# Patient Record
Sex: Female | Born: 1965 | Race: White | Hispanic: No | Marital: Single | Smoking: Former smoker
Health system: Southern US, Community
[De-identification: ages and names within clinical notes are randomized; demographics above are authoritative.]

## PROBLEM LIST (undated history)

## (undated) DIAGNOSIS — R519 Headache, unspecified: Secondary | ICD-10-CM

## (undated) DIAGNOSIS — R200 Anesthesia of skin: Secondary | ICD-10-CM

## (undated) DIAGNOSIS — E119 Type 2 diabetes mellitus without complications: Secondary | ICD-10-CM

## (undated) DIAGNOSIS — G932 Benign intracranial hypertension: Secondary | ICD-10-CM

## (undated) DIAGNOSIS — I509 Heart failure, unspecified: Secondary | ICD-10-CM

## (undated) HISTORY — DX: Headache, unspecified: R51.9

## (undated) HISTORY — DX: Benign intracranial hypertension: G93.2

## (undated) HISTORY — PX: TUBAL LIGATION: SHX77

## (undated) HISTORY — PX: TOOTH EXTRACTION: SUR596

---

## 1998-10-04 ENCOUNTER — Ambulatory Visit: Admission: RE | Admit: 1998-10-04 | Discharge: 1998-10-04 | Payer: Self-pay | Admitting: Family Medicine

## 2013-05-17 DIAGNOSIS — I10 Essential (primary) hypertension: Secondary | ICD-10-CM | POA: Insufficient documentation

## 2013-05-17 DIAGNOSIS — G4733 Obstructive sleep apnea (adult) (pediatric): Secondary | ICD-10-CM | POA: Insufficient documentation

## 2013-06-06 DIAGNOSIS — E119 Type 2 diabetes mellitus without complications: Secondary | ICD-10-CM | POA: Insufficient documentation

## 2013-06-06 DIAGNOSIS — G932 Benign intracranial hypertension: Secondary | ICD-10-CM | POA: Insufficient documentation

## 2016-08-07 DIAGNOSIS — I272 Pulmonary hypertension, unspecified: Secondary | ICD-10-CM | POA: Insufficient documentation

## 2016-08-07 DIAGNOSIS — R0603 Acute respiratory distress: Secondary | ICD-10-CM | POA: Insufficient documentation

## 2016-08-09 DIAGNOSIS — I5033 Acute on chronic diastolic (congestive) heart failure: Secondary | ICD-10-CM | POA: Insufficient documentation

## 2016-08-12 ENCOUNTER — Ambulatory Visit: Payer: Self-pay | Admitting: Allergy and Immunology

## 2017-07-15 DIAGNOSIS — I10 Essential (primary) hypertension: Secondary | ICD-10-CM | POA: Diagnosis not present

## 2017-07-15 DIAGNOSIS — N183 Chronic kidney disease, stage 3 (moderate): Secondary | ICD-10-CM | POA: Diagnosis not present

## 2017-07-15 DIAGNOSIS — E119 Type 2 diabetes mellitus without complications: Secondary | ICD-10-CM | POA: Diagnosis not present

## 2017-07-16 DIAGNOSIS — N183 Chronic kidney disease, stage 3 (moderate): Secondary | ICD-10-CM | POA: Diagnosis not present

## 2017-07-16 DIAGNOSIS — I10 Essential (primary) hypertension: Secondary | ICD-10-CM

## 2017-07-16 DIAGNOSIS — E119 Type 2 diabetes mellitus without complications: Secondary | ICD-10-CM

## 2017-07-16 DIAGNOSIS — I509 Heart failure, unspecified: Secondary | ICD-10-CM

## 2017-07-16 DIAGNOSIS — R0602 Shortness of breath: Secondary | ICD-10-CM

## 2017-07-17 DIAGNOSIS — E119 Type 2 diabetes mellitus without complications: Secondary | ICD-10-CM | POA: Diagnosis not present

## 2017-07-17 DIAGNOSIS — I10 Essential (primary) hypertension: Secondary | ICD-10-CM | POA: Diagnosis not present

## 2017-07-17 DIAGNOSIS — N183 Chronic kidney disease, stage 3 (moderate): Secondary | ICD-10-CM | POA: Diagnosis not present

## 2017-07-18 DIAGNOSIS — N183 Chronic kidney disease, stage 3 (moderate): Secondary | ICD-10-CM | POA: Diagnosis not present

## 2017-07-18 DIAGNOSIS — E119 Type 2 diabetes mellitus without complications: Secondary | ICD-10-CM | POA: Diagnosis not present

## 2017-07-18 DIAGNOSIS — I10 Essential (primary) hypertension: Secondary | ICD-10-CM | POA: Diagnosis not present

## 2017-07-19 DIAGNOSIS — J9611 Chronic respiratory failure with hypoxia: Secondary | ICD-10-CM

## 2017-07-19 DIAGNOSIS — I10 Essential (primary) hypertension: Secondary | ICD-10-CM | POA: Diagnosis not present

## 2017-07-19 DIAGNOSIS — E119 Type 2 diabetes mellitus without complications: Secondary | ICD-10-CM | POA: Diagnosis not present

## 2017-07-19 DIAGNOSIS — N183 Chronic kidney disease, stage 3 (moderate): Secondary | ICD-10-CM | POA: Diagnosis not present

## 2017-07-20 DIAGNOSIS — N183 Chronic kidney disease, stage 3 (moderate): Secondary | ICD-10-CM | POA: Diagnosis not present

## 2017-07-20 DIAGNOSIS — I10 Essential (primary) hypertension: Secondary | ICD-10-CM | POA: Diagnosis not present

## 2017-07-20 DIAGNOSIS — E119 Type 2 diabetes mellitus without complications: Secondary | ICD-10-CM | POA: Diagnosis not present

## 2017-07-21 DIAGNOSIS — I10 Essential (primary) hypertension: Secondary | ICD-10-CM | POA: Diagnosis not present

## 2017-07-21 DIAGNOSIS — N183 Chronic kidney disease, stage 3 (moderate): Secondary | ICD-10-CM | POA: Diagnosis not present

## 2017-07-21 DIAGNOSIS — E119 Type 2 diabetes mellitus without complications: Secondary | ICD-10-CM | POA: Diagnosis not present

## 2017-07-22 DIAGNOSIS — E119 Type 2 diabetes mellitus without complications: Secondary | ICD-10-CM | POA: Diagnosis not present

## 2017-07-22 DIAGNOSIS — I10 Essential (primary) hypertension: Secondary | ICD-10-CM | POA: Diagnosis not present

## 2017-07-22 DIAGNOSIS — N183 Chronic kidney disease, stage 3 (moderate): Secondary | ICD-10-CM | POA: Diagnosis not present

## 2017-08-23 ENCOUNTER — Ambulatory Visit (INDEPENDENT_AMBULATORY_CARE_PROVIDER_SITE_OTHER): Payer: Medicaid Other | Admitting: Cardiology

## 2017-08-23 ENCOUNTER — Encounter: Payer: Self-pay | Admitting: Cardiology

## 2017-08-23 VITALS — BP 126/82 | HR 79 | Ht 70.0 in | Wt 374.0 lb

## 2017-08-23 DIAGNOSIS — I5032 Chronic diastolic (congestive) heart failure: Secondary | ICD-10-CM | POA: Diagnosis not present

## 2017-08-23 DIAGNOSIS — I272 Pulmonary hypertension, unspecified: Secondary | ICD-10-CM | POA: Diagnosis not present

## 2017-08-23 DIAGNOSIS — E119 Type 2 diabetes mellitus without complications: Secondary | ICD-10-CM

## 2017-08-23 DIAGNOSIS — I5030 Unspecified diastolic (congestive) heart failure: Secondary | ICD-10-CM | POA: Insufficient documentation

## 2017-08-23 DIAGNOSIS — I10 Essential (primary) hypertension: Secondary | ICD-10-CM

## 2017-08-23 LAB — BASIC METABOLIC PANEL
BUN/Creatinine Ratio: 20 (ref 9–23)
BUN: 24 mg/dL (ref 6–24)
CALCIUM: 9.5 mg/dL (ref 8.7–10.2)
CHLORIDE: 94 mmol/L — AB (ref 96–106)
CO2: 31 mmol/L — ABNORMAL HIGH (ref 20–29)
Creatinine, Ser: 1.18 mg/dL — ABNORMAL HIGH (ref 0.57–1.00)
GFR calc non Af Amer: 54 mL/min/{1.73_m2} — ABNORMAL LOW (ref >59)
GFR, EST AFRICAN AMERICAN: 62 mL/min/{1.73_m2} (ref >59)
GLUCOSE: 171 mg/dL — AB (ref 65–99)
POTASSIUM: 4.5 mmol/L (ref 3.5–5.2)
Sodium: 137 mmol/L (ref 134–144)

## 2017-08-23 LAB — PRO B NATRIURETIC PEPTIDE: NT-PRO BNP: 125 pg/mL (ref 0–249)

## 2017-08-23 NOTE — Progress Notes (Signed)
Cardiology Consultation:    Date:  08/23/2017   ID:  Emily Mooney, DOB [birth year], MRN [id number]  PCP:  Wilmer Floorampbell, Stephen D., MD  Cardiologist:  Gypsy Balsamobert Corneluis Allston, MD   Referring MD: Wilmer Floorampbell, Stephen D., MD   No chief complaint on file. I am short of breath  History of Present Illness:    Emily Mooney is a 52 y.o. female who is being seen today for the evaluation of diastolic congestive heart failure as well as pulmonary hypertension at the request of Wilmer Floorampbell, Stephen D., MD.  Few weeks ago she ended up going to the emergency room she was admitted she was diagnosed with diastolic congestive heart failure and she was treated appropriately with diuretics.  She lost 25 kg while in the hospital.  She was discharged home with instruction to follow-up with us.  She is still complaining of having some shortness of breath but much better than before.  Additional diagnosis that has been established while she was in the hospital is pulmonary hypertension.  She was also told that she most likely have sleep apnea and she required to be tested for it.  However apparently insurance company refused to pay for it.  Overall she does not do much she sits home all the time.  When she walks she gets short of breath fairly easily.  She does have some swelling of lower extremities but overall her weight has been maintained since that she discharged from hospital.  She takes only very small dose of water pill 20 mg of furosemide.  No potassium supplementation.  She snores a lot and years ago she had sleep study done and she was advised to use mask however her machine broke and she does not use it anymore.  She does have diabetes which was poorly controlled.  She is taking low intensity statin only Mevacor 40 mg with instruction to take only 20.  No past medical history on file.    Current Medications: Current Meds  Medication Sig  . carvedilol (COREG) 3.125 MG tablet Take 3.125 mg by mouth 2 (two) times daily.    . furosemide (LASIX) 20 MG tablet Take 1 tablet by mouth daily.  . furosemide (LASIX) 40 MG tablet Take 40 mg by mouth daily.  Marland Kitchen. glipiZIDE (GLUCOTROL) 5 MG tablet Take 1 tablet by mouth daily.  Marland Kitchen. lisinopril (PRINIVIL,ZESTRIL) 5 MG tablet Take 5 mg by mouth daily.  Marland Kitchen. lovastatin (MEVACOR) 40 MG tablet 20 mg.  . Omega-3 Fatty Acids (FISH OIL) 1000 MG CAPS Take 1 capsule by mouth daily.  Marland Kitchen. triamcinolone ointment (KENALOG) 0.5 % Apply 1 application topically as needed.     Allergies:   Patient has no known allergies.   Social History   Socioeconomic History  . Marital status: Single    Spouse name: Not on file  . Number of children: Not on file  . Years of education: Not on file  . Highest education level: Not on file  Occupational History  . Not on file  Social Needs  . Financial resource strain: Not on file  . Food insecurity:    Worry: Not on file    Inability: Not on file  . Transportation needs:    Medical: Not on file    Non-medical: Not on file  Tobacco Use  . Smoking status: Former Games developermoker  . Smokeless tobacco: Never Used  Substance and Sexual Activity  . Alcohol use: Not Currently  . Drug use: Not on file  . Sexual activity:  Not on file  Lifestyle  . Physical activity:    Days per week: Not on file    Minutes per session: Not on file  . Stress: Not on file  Relationships  . Social connections:    Talks on phone: Not on file    Gets together: Not on file    Attends religious service: Not on file    Active member of club or organization: Not on file    Attends meetings of clubs or organizations: Not on file    Relationship status: Not on file  Other Topics Concern  . Not on file  Social History Narrative  . Not on file     Family History: The patient's family history includes Cancer in her father. ROS:   Please see the history of present illness.    All 14 point review of systems negative except as described per history of present  illness.  EKGs/Labs/Other Studies Reviewed:    The following studies were reviewed today:   Recent Labs: No results found for requested labs within last 8760 hours.  Recent Lipid Panel No results found for: CHOL, TRIG, HDL, CHOLHDL, VLDL, LDLCALC, LDLDIRECT  Physical Exam:    VS:  BP 126/82 (BP Location: Right Arm, Patient Position: Sitting, Cuff Size: Normal)   Pulse 79   Ht 5\' 10"  (1.778 m)   Wt (!) 374 lb (169.6 kg)   SpO2 97%   BMI 53.66 kg/m     Wt Readings from Last 3 Encounters:  08/23/17 (!) 374 lb (169.6 kg)     GEN:  Well nourished, well developed in no acute distress HEENT: Normal NECK: No JVD; No carotid bruits LYMPHATICS: No lymphadenopathy CARDIAC: RRR, no murmurs, no rubs, no gallops RESPIRATORY:  Clear to auscultation without rales, wheezing or rhonchi  ABDOMEN: Soft, non-tender, non-distended MUSCULOSKELETAL:  No edema; No deformity  SKIN: Warm and dry NEUROLOGIC:  Alert and oriented x 3 PSYCHIATRIC:  Normal affect   ASSESSMENT:    1. Essential hypertension   2. Pulmonary HTN (HCC)   3. Chronic diastolic congestive heart failure (HCC)   4. Morbid obesity (HCC)   5. Type 2 diabetes mellitus without complication, without long-term current use of insulin (HCC)    PLAN:    In order of problems listed above:  1. Dyspnea on exertion.  Clearly multifactorial.  She does have morbid obesity also diastolic congestive heart failure as well as pulmonary hypertension.  All this problem need to be managed.  I will ask you to have Chem-7 today as well as proBNP based on that we will see if we need to intensify her diuresis.  Likely her weight has been stable since discharge from hospital.  I did review echocardiogram done in the hospital showed preserved left ventricular ejection fraction the most alarming to me however is the fact that her pulmonary pressures 59 mmHg. 2. Pulmonary hypertension with pulmonary pressure 59 mmHg measured by echocardiogram.  I suspect  the reason for this is a sleep apnea plus hypoventilation syndrome with hypoxia.  She is instructed to use oxygen all the time.  However she is in office without oxygen today.  I will schedule her to have a sleep study after that I am convinced she will see pulmonologist.  Will be able to address those issues he may require cardiac catheterization in the future I would look at her right side pressures to determine therapy needed for more pulmonary hypertension however we need to start from basic managing  her hypoxia as well as potential sleep apnea.   3. Morbid obesity obviously a problem.  We talked about diet and exercises on the regular basis.   4. Diabetes: Apparently poorly controlled.  Will check hemoglobin A1c.    Overall fairly sick lady required frequent visit.  I will schedule her to have sleep study Chem-7 as well as proBNP will be done today.  Will initiate aspirin therapy.  I see her back in my office in about a month.   Edication Adjustments/Labs and Tests Ordered: Current medicines are reviewed at length with the patient today.  Concerns regarding medicines are outlined above.  No orders of the defined types were placed in this encounter.  No orders of the defined types were placed in this encounter.   Signed, Georgeanna Lea, MD, [facility]. 08/23/2017 10:37 AM    [facility]

## 2017-08-23 NOTE — Patient Instructions (Signed)
Medication Instructions:  Your physician recommends that you continue on your current medications as directed. Please refer to the Current Medication list given to you today.  Labwork: Your physician recommends that you have the following labs drawn: Bmp and BNp  Testing/Procedures: Your physician has recommended that you have a sleep study. This test records several body functions during sleep, including: brain activity, eye movement, oxygen and carbon dioxide blood levels, heart rate and rhythm, breathing rate and rhythm, the flow of air through your mouth and nose, snoring, body muscle movements, and chest and belly movement.  Follow-Up: Your physician recommends that you schedule a follow-up appointment in: 1 month  Any Other Special Instructions Will Be Listed Below (If Applicable).     If you need a refill on your cardiac medications before your next appointment, please call your pharmacy.   [facility]  Garey HamAshley A, RN, BSN

## 2017-08-24 ENCOUNTER — Telehealth: Payer: Self-pay

## 2017-08-24 ENCOUNTER — Telehealth: Payer: Self-pay | Admitting: Cardiology

## 2017-08-24 ENCOUNTER — Telehealth: Payer: Self-pay | Admitting: *Deleted

## 2017-08-24 NOTE — Telephone Encounter (Signed)
-----   Message from Craige CottaAshley S Anderson, RN sent at 08/23/2017 10:45 AM EDT ----- Regarding: Sleep study [facility]

## 2017-08-24 NOTE — Telephone Encounter (Signed)
Staff message sent to [place] per [employer] no PA required. Ok to schedule sleep study.

## 2017-08-24 NOTE — Telephone Encounter (Signed)
Called patient and notified her of her lab results.  

## 2017-08-25 NOTE — Telephone Encounter (Signed)
-----   Message from Gaynelle CageWanda M Waddell, CMA sent at 08/24/2017  4:46 PM EDT ----- Regarding: RE: Sleep study Per patient's insurance no PA required. Ok to schedule. Call [phone] schedule. ----- Message ----- From: Craige CottaAnderson, Ashley S, RN Sent: 08/23/2017  10:45 AM To: [facility] Subject: Sleep study                                    [facility]

## 2017-08-25 NOTE — Telephone Encounter (Signed)
Informed patient of her sleep study date and time, address was provided.

## 2017-08-29 ENCOUNTER — Encounter: Payer: Self-pay | Admitting: Neurology

## 2017-08-30 ENCOUNTER — Emergency Department (HOSPITAL_COMMUNITY)
Admission: EM | Admit: 2017-08-30 | Discharge: 2017-08-31 | Disposition: A | Discharge status: Home / Self Care | Payer: PRIVATE HEALTH INSURANCE | Attending: Emergency Medicine | Admitting: Emergency Medicine

## 2017-08-30 ENCOUNTER — Emergency Department (HOSPITAL_COMMUNITY): Payer: PRIVATE HEALTH INSURANCE

## 2017-08-30 ENCOUNTER — Encounter (HOSPITAL_COMMUNITY): Payer: Self-pay

## 2017-08-30 DIAGNOSIS — I5033 Acute on chronic diastolic (congestive) heart failure: Secondary | ICD-10-CM | POA: Insufficient documentation

## 2017-08-30 DIAGNOSIS — E119 Type 2 diabetes mellitus without complications: Secondary | ICD-10-CM | POA: Diagnosis not present

## 2017-08-30 DIAGNOSIS — Z87891 Personal history of nicotine dependence: Secondary | ICD-10-CM | POA: Diagnosis not present

## 2017-08-30 DIAGNOSIS — G932 Benign intracranial hypertension: Secondary | ICD-10-CM

## 2017-08-30 DIAGNOSIS — I110 Hypertensive heart disease with heart failure: Secondary | ICD-10-CM | POA: Diagnosis not present

## 2017-08-30 DIAGNOSIS — H547 Unspecified visual loss: Secondary | ICD-10-CM | POA: Diagnosis not present

## 2017-08-30 DIAGNOSIS — H543 Unqualified visual loss, both eyes: Secondary | ICD-10-CM

## 2017-08-30 DIAGNOSIS — Z79899 Other long term (current) drug therapy: Secondary | ICD-10-CM | POA: Diagnosis not present

## 2017-08-30 DIAGNOSIS — Z7984 Long term (current) use of oral hypoglycemic drugs: Secondary | ICD-10-CM | POA: Insufficient documentation

## 2017-08-30 HISTORY — DX: Heart failure, unspecified: I50.9

## 2017-08-30 HISTORY — DX: Type 2 diabetes mellitus without complications: E11.9

## 2017-08-30 HISTORY — DX: Anesthesia of skin: R20.0

## 2017-08-30 LAB — CBC WITH DIFFERENTIAL/PLATELET
Abs Immature Granulocytes: 0 10*3/uL (ref 0.0–0.1)
BASOS ABS: 0 10*3/uL (ref 0.0–0.1)
Basophils Relative: 0 %
Eosinophils Absolute: 0.4 10*3/uL (ref 0.0–0.7)
Eosinophils Relative: 4 %
HCT: 37.3 % (ref 36.0–46.0)
HEMOGLOBIN: 11.8 g/dL — AB (ref 12.0–15.0)
Immature Granulocytes: 0 %
LYMPHS PCT: 21 %
Lymphs Abs: 1.8 10*3/uL (ref 0.7–4.0)
MCH: 28.3 pg (ref 26.0–34.0)
MCHC: 31.6 g/dL (ref 30.0–36.0)
MCV: 89.4 fL (ref 78.0–100.0)
MONO ABS: 0.5 10*3/uL (ref 0.1–1.0)
Monocytes Relative: 6 %
NEUTROS ABS: 5.7 10*3/uL (ref 1.7–7.7)
Neutrophils Relative %: 69 %
Platelets: 237 10*3/uL (ref 150–400)
RBC: 4.17 MIL/uL (ref 3.87–5.11)
RDW: 18.8 % — ABNORMAL HIGH (ref 11.5–15.5)
WBC: 8.4 10*3/uL (ref 4.0–10.5)

## 2017-08-30 LAB — BASIC METABOLIC PANEL
Anion gap: 11 (ref 5–15)
BUN: 34 mg/dL — ABNORMAL HIGH (ref 6–20)
CALCIUM: 9.3 mg/dL (ref 8.9–10.3)
CHLORIDE: 92 mmol/L — AB (ref 98–111)
CO2: 34 mmol/L — ABNORMAL HIGH (ref 22–32)
Creatinine, Ser: 1.39 mg/dL — ABNORMAL HIGH (ref 0.44–1.00)
GFR calc Af Amer: 50 mL/min — ABNORMAL LOW (ref >60)
GFR calc non Af Amer: 43 mL/min — ABNORMAL LOW (ref >60)
Glucose, Bld: 163 mg/dL — ABNORMAL HIGH (ref 70–99)
POTASSIUM: 4.3 mmol/L (ref 3.5–5.1)
SODIUM: 137 mmol/L (ref 135–145)

## 2017-08-30 MED ORDER — ACETAZOLAMIDE 250 MG PO TABS
500.0000 mg | ORAL_TABLET | Freq: Once | ORAL | Status: AC
  Administered 2017-08-30: 500 mg via ORAL
  Filled 2017-08-30 (×2): qty 2

## 2017-08-30 MED ORDER — LIDOCAINE HCL 2 % IJ SOLN
20.0000 mL | Freq: Once | INTRAMUSCULAR | Status: AC
  Administered 2017-08-30: 400 mg via INTRADERMAL
  Filled 2017-08-30: qty 20

## 2017-08-30 NOTE — ED Triage Notes (Signed)
[facility].  Visual field interpretation findings:   OD complete field loss, small central remaining on confrontation OS small 3 degree central area remaining, small ten remaining on confrontation  Pt has appt with neurology 10/2017 but recommends being seen in ED today.

## 2017-08-30 NOTE — ED Provider Notes (Signed)
[facility] EMERGENCY DEPARTMENT Provider Note   CSN: [id number] Arrival date & time: 08/30/17  1208     History   Chief Complaint Chief Complaint  Patient presents with  . Eye Problem    HPI Emily Mooney is a 52 y.o. female.  Patient with history of idiopathic intercranial hypertension, 3 previous lumbar punctures --presents to the emergency department with progressive vision loss over the past 2 months.  Patient saw an ophthalmologist today.  She had visual testing showing minimal remaining central vision in each eye.  Patient describes loss of vision in the periphery of both eyes, right greater than left.  Symptoms fluctuate at times and everything will "go gray".  Patient denies signs of stroke including: facial droop, slurred speech, aphasia, weakness/numbness in extremities, imbalance/trouble walking.  Patient was recently in the hospital for congestive heart failure and states that she had 60 pounds of fluid taken off of her.  Her breathing is currently stable.  She is oxygen dependent.  No chest pain or shortness of breath.  The onset of this condition was gradual. The course is worsening. Aggravating factors: none. Alleviating factors: none.        Past Medical History:  Diagnosis Date  . CHF (congestive heart failure) (HCC)   . Diabetes mellitus without complication (HCC)   . Numbness in feet     Patient Active Problem List   Diagnosis Date Noted  . Diastolic congestive heart failure (HCC) 08/23/2017  . Morbid obesity (HCC) 08/23/2017  . Acute on chronic diastolic congestive heart failure (HCC) 08/09/2016  . Acute respiratory distress 08/07/2016  . Pulmonary HTN (HCC) 08/07/2016  . Benign intracranial hypertension 06/06/2013  . Diabetes (HCC) 06/06/2013  . Hypertension 05/17/2013  . Obstructive sleep apnea 05/17/2013    Past Surgical History:  Procedure Laterality Date  . TOOTH EXTRACTION    . TUBAL LIGATION       OB History   None       Home Medications    Prior to Admission medications   Medication Sig Start Date End Date Taking? Authorizing Provider  carvedilol (COREG) 3.125 MG tablet Take 3.125 mg by mouth 2 (two) times daily. 08/05/17  Yes [provider]  CINNAMON PO Take 1,000 mg by mouth daily.   Yes [provider]  furosemide (LASIX) 40 MG tablet Take 40 mg by mouth daily. May increase to 60mg  if weight up 3 pounds   Yes [provider]  glipiZIDE (GLUCOTROL XL) 5 MG 24 hr tablet Take 5 mg by mouth daily with breakfast.   Yes [provider]  lisinopril (PRINIVIL,ZESTRIL) 5 MG tablet Take 5 mg by mouth daily. 08/01/17  Yes [provider]  lovastatin (MEVACOR) 20 MG tablet Take 40 mg by mouth daily.   Yes [provider]  Omega-3 Fatty Acids (FISH OIL) 1000 MG CAPS Take 3,000 mg by mouth daily.    Yes [provider]  SitaGLIPtin-MetFORMIN HCl (JANUMET XR) 50-1000 MG TB24 Take 1 tablet by mouth daily.   Yes [provider]  triamcinolone ointment (KENALOG) 0.5 % Apply 1 application topically 2 (two) times daily.  05/18/17  Yes [provider]    Family History Family History  Problem Relation Age of Onset  . Cancer Father     Social History Social History   Tobacco Use  . Smoking status: Former Games developer  . Smokeless tobacco: Never Used  Substance Use Topics  . Alcohol use: Not Currently  . Drug use:  Never     Allergies   Patient has no known allergies.   Review of Systems Review of Systems  Constitutional: Negative for fever.  HENT: Negative for rhinorrhea and sore throat.   Eyes: Positive for visual disturbance. Negative for redness.  Respiratory: Negative for cough.   Cardiovascular: Negative for chest pain.  Gastrointestinal: Negative for abdominal pain, diarrhea, nausea and vomiting.  Genitourinary: Negative for dysuria.  Musculoskeletal: Negative for myalgias.  Skin: Negative for rash.  Neurological:  Negative for headaches.     Physical Exam Updated Vital Signs BP 120/72   Pulse 66   Temp 98 F (36.7 C) (Oral)   Resp 16   SpO2 92%   Physical Exam  Constitutional: She appears well-developed and well-nourished.  HENT:  Head: Normocephalic and atraumatic.  Eyes: Conjunctivae are normal. Right eye exhibits no discharge. Left eye exhibits no discharge.  Neck: Normal range of motion. Neck supple.  Cardiovascular: Normal rate, regular rhythm and normal heart sounds.  Pulmonary/Chest: Effort normal and breath sounds normal.  Abdominal: Soft. There is no tenderness.  Neurological: She is alert. She has normal strength. A cranial nerve deficit is present. No sensory deficit. She displays a negative Romberg sign. GCS eye subscore is 4. GCS verbal subscore is 5. GCS motor subscore is 6.  Patient with minimal central vision on confrontation.   Skin: Skin is warm and dry.  Psychiatric: She has a normal mood and affect.  Nursing note and vitals reviewed.    ED Treatments / Results  Labs (all labs ordered are listed, but only abnormal results are displayed) Labs Reviewed  CBC WITH DIFFERENTIAL/PLATELET - Abnormal; Notable for the following components:      Result Value   Hemoglobin 11.8 (*)    RDW 18.8 (*)    All other components within normal limits  BASIC METABOLIC PANEL - Abnormal; Notable for the following components:   Chloride 92 (*)    CO2 34 (*)    Glucose, Bld 163 (*)    BUN 34 (*)    Creatinine, Ser 1.39 (*)    GFR calc non Af Amer 43 (*)    GFR calc Af Amer 50 (*)    All other components within normal limits    EKG None  Radiology Ct Head Wo Contrast  Result Date: 08/30/2017 CLINICAL DATA:  Idiopathic intracranial hypertension with progressive visual loss over the past 2 months. EXAM: CT HEAD WITHOUT CONTRAST TECHNIQUE: Contiguous axial images were obtained from the base of the skull through the vertex without intravenous contrast. COMPARISON:  08/08/2016  FINDINGS: Brain: Slight flattening of the posterior globes with slight kinking of the optic nerves can be seen in the setting of idiopathic intracranial hypertension (IIH). Additionally there is a partially empty pituitary sella which can also be seen in the setting of IIH. No acute intracranial hemorrhage, midline shift or edema. No large vascular territory infarct. No intra-axial mass nor extra-axial fluid collections. No hydrocephalus or slit-like ventricles. Vascular: No hyperdense vessel sign. Skull: Normal Sinuses/Orbits: Clear paranasal sinuses and mastoids. Flattening of the posterior aspect of the globes and slight kinking of the optic nerves as above described. Other: None IMPRESSION: Stigmata of idiopathic intracranial hypertension with slight kinking of the optic nerves bilaterally, flattening of the posterior aspect of the globes and anterior partially empty appearing pituitary sella. Electronically Signed   By: Tollie Eth M.D.   On: 08/30/2017 20:33    Procedures Procedures (including critical care time)  Medications Ordered in ED  Medications  lidocaine (XYLOCAINE) 2 % (with pres) injection 400 mg (400 mg Intradermal Given 08/30/17 2334)  acetaZOLAMIDE (DIAMOX) tablet 500 mg (500 mg Oral Given 08/30/17 2328)     Initial Impression / Assessment and Plan / ED Course  I have reviewed the triage vital signs and the nursing notes.  Pertinent labs & imaging results that were available during my care of the patient were reviewed by me and considered in my medical decision making (see chart for details).     Patient seen and examined. Head CT pending. Labs ordered. Discussed with Dr. Anitra LauthPlunkett.   Vital signs reviewed and are as follows: BP 120/72   Pulse 66   Temp 98 F (36.7 C) (Oral)   Resp 16   SpO2 92%   Spoke with Dr. Otelia LimesLindzen -- states LP with manometry, drain 20cc or to pressure of 20.   I attempted x 1 with patient in left lateral decub without success. Dr. Anitra LauthPlunkett then  attempted and was successful with patient sitting up. Pressure from > 45 to 20 after drainage of 30cc. Please see her note for details.   Diamox ordered and given in ED.  Discussed with patient the importance of her following up with both ophthalmology and with neurology.  Discussed that she may need an advanced procedure such as a fenestration or VP shunt and follow-up is extraordinarily important for this.  Discussed that we are trying to save her remaining central vision.  Also discussed the importance of compliance with medications.  Will start patient back on Diamox tonight.  12:30 AM Patient has done well after LP.  She is ready for discharge.  Ambulatory for referral to neurology placed.  Post lumbar puncture recommendations given.  Final Clinical Impressions(s) / ED Diagnoses   Final diagnoses:  Idiopathic intracranial hypertension  Vision loss, bilateral   Patient with pseudotumor cerebri with progressive vision loss.  This has been progressive over months.  Therapeutic lumbar puncture performed tonight as documented.  Stressed importance of follow-up with ophthalmology and neurology.  Stressed importance of restarting Diamox.  Patient verbalizes understanding.  Comfortable discharged home at this time.  ED Discharge Orders        Ordered    acetaZOLAMIDE (DIAMOX) 250 MG tablet  2 times daily     08/31/17 0019    Ambulatory referral to Neurology    Comments:  An appointment is requested in approximately: 1 week  H/o pseudotumor, progressive vision loss x months, LP performed in ED tonight.   08/31/17 0021       Renne CriglerGeiple, Faysal Fenoglio, PA-C 08/31/17 0031    Gwyneth SproutPlunkett, Whitney, MD 09/02/17 [phone]

## 2017-08-31 MED ORDER — ACETAZOLAMIDE 250 MG PO TABS: 500.0000 mg | ORAL_TABLET | Freq: Two times a day (BID) | ORAL | 0 refills | Status: DC

## 2017-08-31 NOTE — ED Notes (Signed)
Patient verbalizes understanding of discharge instructions. Opportunity for questioning and answers were provided. Armband removed by staff, pt discharged from ED in wheelchair.  

## 2017-08-31 NOTE — ED Provider Notes (Signed)
.  Lumbar Puncture Date/Time: 08/31/2017 12:23 AM Performed by: Gwyneth SproutPlunkett, Miliana Gangwer, MD Authorized by: Gwyneth SproutPlunkett, Madia Carvell, MD   Consent:    Consent obtained:  Written   Consent given by:  Patient   Risks discussed:  Bleeding, headache, infection, pain and repeat procedure   Alternatives discussed:  No treatment Pre-procedure details:    Procedure purpose:  Therapeutic   Preparation: Patient was prepped and draped in usual sterile fashion   Anesthesia (see MAR for exact dosages):    Anesthesia method:  Local infiltration   Local anesthetic:  Lidocaine 2% WITH epi Procedure details:    Lumbar space:  L4-L5 interspace   Patient position:  Sitting   Needle gauge:  22   Needle type:  Diamond point   Needle length (in):  3.5   Ultrasound guidance: no     Number of attempts:  3   Opening pressure (cm H2O):  54   Closing pressure (cm H2O):  20   Fluid appearance:  Clear   Tubes of fluid:  4   Total volume (ml):  30 Post-procedure:    Puncture site:  Adhesive bandage applied   Patient tolerance of procedure:  Tolerated well, no immediate complications      Gwyneth SproutPlunkett, Domenik Trice, MD 08/31/17 [id number]

## 2017-08-31 NOTE — Discharge Instructions (Signed)
Please read and follow all provided instructions.  Your diagnoses today include:  1. Idiopathic intracranial hypertension   2. Vision loss, bilateral     Tests performed today include:  CT head - shows signs of increased pressure  Vital signs. See below for your results today.   Medications prescribed:   Diamox  Take any prescribed medications only as directed.  Home care instructions:  Follow any educational materials contained in this packet.  BE VERY CAREFUL not to take multiple medicines containing Tylenol (also called acetaminophen). Doing so can lead to an overdose which can damage your liver and cause liver failure and possibly death.   Follow-up instructions: Follow-up with your neurologist as soon as possible and follow-up with your eye specialist for further evaluation.  Return instructions:   Please return to the Emergency Department if you experience worsening symptoms.   Please return if you have any other emergent concerns.  Additional Information:  Your vital signs today were: BP 122/73    Pulse 67    Temp 98 F (36.7 C) (Oral)    Resp 18    SpO2 100%  If your blood pressure (BP) was elevated above 135/85 this visit, please have this repeated by your doctor within one month. --------------

## 2017-09-02 ENCOUNTER — Encounter: Payer: Self-pay | Admitting: Diagnostic Neuroimaging

## 2017-09-02 ENCOUNTER — Ambulatory Visit (INDEPENDENT_AMBULATORY_CARE_PROVIDER_SITE_OTHER): Payer: PRIVATE HEALTH INSURANCE | Admitting: Diagnostic Neuroimaging

## 2017-09-02 ENCOUNTER — Telehealth: Payer: Self-pay

## 2017-09-02 VITALS — BP 125/61 | HR 85 | Ht 70.0 in | Wt 363.0 lb

## 2017-09-02 DIAGNOSIS — H543 Unqualified visual loss, both eyes: Secondary | ICD-10-CM

## 2017-09-02 DIAGNOSIS — H4711 Papilledema associated with increased intracranial pressure: Secondary | ICD-10-CM | POA: Diagnosis not present

## 2017-09-02 DIAGNOSIS — G932 Benign intracranial hypertension: Secondary | ICD-10-CM | POA: Diagnosis not present

## 2017-09-02 DIAGNOSIS — G4733 Obstructive sleep apnea (adult) (pediatric): Secondary | ICD-10-CM | POA: Diagnosis not present

## 2017-09-02 DIAGNOSIS — I5032 Chronic diastolic (congestive) heart failure: Secondary | ICD-10-CM | POA: Diagnosis not present

## 2017-09-02 MED ORDER — ACETAZOLAMIDE ER 500 MG PO CP12: 500.0000 mg | ORAL_CAPSULE | Freq: Two times a day (BID) | ORAL | 12 refills | Status: DC

## 2017-09-02 NOTE — Patient Instructions (Signed)
idiopathic intracranial hypertension (pseudotumor cerebri)  - start acetazolamide ER 500mg  twice a day  - refer back to neurosurgery (Dr. Petra KubaKilpatrick, Epifanio LeschesMichaux Renata) for consideration of shunt due to severe symptoms and to avoid permanent vision loss - follow up with ophthalmology [facility] - follow up with PCP, cardiology

## 2017-09-02 NOTE — Telephone Encounter (Signed)
I called [facility], per Dr. Marjory LiesPenumalli request, to advise them that pt has had a consult today with Dr. Marjory LiesPenumalli regarding pt's papilledema. They did not answer the phone. Will call them again later.

## 2017-09-02 NOTE — Progress Notes (Signed)
[facility]  PATIENT: Emily Mooney DOB: December 12, 1965  REFERRING CLINICIAN: ER  HISTORY FROM: patient and chart review  REASON FOR VISIT: new consult    HISTORICAL  CHIEF COMPLAINT:  Chief Complaint  Patient presents with  . New Patient (Initial Visit)    RM 7, with father, Dennard Nip. F/u for pseudotumor, progressive vision loss. Pt reports that her left eye is worse that her right. Pt has not started diamox due to cost but plans on picking it up.   . Vision:    Without correction- R:20/50, L: unable to see anything, B: 20/200    HISTORY OF PRESENT ILLNESS:   NEW HPI (09/02/17, VRP): 52 year old female with history of heart failure, obesity, pseudotumor cerebri, here for evaluation.  I reviewed prior neurology notes which are referred to below.  Patient was initially diagnosed with pseudotumor cerebri in 2004, tried on Topamax and Diamox in the past.  She was referred for bariatric surgery as well as VP shunt placement in the past but could not afford these due to financial limitations.  In the past 1 year patient was not able to afford her medications and therefore stopped taking her medications.  Patient symptoms have worsened.  3 months ago she started to have increasing blurred vision, transient visual obscurations, ultimately going to the emergency room on 08/30/2017.  Lumbar puncture was obtained which showed opening pressure of 54 cm of water.  Closing pressure was 20 cm water.  Patient was prescribed acetazolamide but she did not start this as she was not able to afford her medication.  Patient having some mild headaches and tinnitus.  PRIOR RECORDS PER AMY KEARNS, PA-c: " Emily Mooney is a 52 y.o. female seen at [facility] for a follow up visit for the following: Pseudotumor cerebri. The onset of the blurring of vision has been sudden and has been occurring in an intermittent (mostly notices blurred vision when writing) pattern for  months (started in January). The course has been recurrent. The blurring of vision is characterized as painless and a blurring of vision. The symptoms have been associated with diabetes mellitus, while the symptoms have not been associated with diplopia or headache. Note for "Blurring of vision": [name] was referred to our office by her optometrist due to recent exam findings of bilateral papilledema. Her PMH is significant for pseudotumor cerebri. She was previously diagnosed with this around 2004 (treated by Dr. Orie Rout). She denies ever having a scan of her brain and no records are available. Past treatment for the pseudotumor cerebri includes a large volume spinal tap, Diamox, and Topamax. She is unsure how long ago she stopped taking the medications. Denies any recent weight gain. She actually reports a weight loss of 56 lbs since January of this year.  Update 06/06/13 (above notes are from initial evaluation). [name] is here today for follow up after having a MRI of her brain. The results of the MRI were reviewed with her (under Diagnostic Studies). She reports some improvement in her blurred vision. She is now getting a headache every time she coughs.  Update 07/25/13. She is here today for follow up after having a LP on 07/05/13. Her opening pressure of 37 cm was diagnostic of pseudotumor cerebri. She was started on Diamox 250 mg, 2 pills tid after the LP. She denies having any side effects from the medication but has noticed an increased frequency in urinating. She reports having 5-6 headaches per week. The headaches last only  a few seconds and are described as sharp. She does not take anything for acute treatment of the headaches since they only last a few seconds. She has not returned to the optometrist for a recheck.  Update 08/29/13. She had a dilated eye exam on 08/02/13. Per Dr. Guillermina [place] note she still had subtle papilledema (OD > OS). She has been taking the Diamox as directed. She is still having  some blurred vision and brief headaches daily. She has lost 8 lbs since her last appointment.   Update 10/10/13. Topamax was added at the last appointment. She is now taking 75 mg with no side effects reported. She reports that her headaches have stopped. She is also no longer having any blurred vision. Her weight is down 4 lbs since her last appointment.   Update 12/12/13. After her last appointment she was evaluated by optometry again who found that she still had some mild papilledema. The dose of Topamax was then increased to 150 mg. The only side effect reported is intermittent paresthesias in her hands and feet. She also has experienced some dizziness but is not sure if it is related to the Topamax. She still has intermittent blurred vision and brief sharp headaches. She has not lost any more weight.   Update 02/13/14. At the last appointment the dose of Topamax was increased to 200 mg and she was also referred to bariatric surgery. Unfortunately her insurance will not cover bariatric surgery. She denies having any side effects from the increased dose of Topamax. She reports having blurred vision but only when reading. She reports that the frequency of her brief headaches has decreased. She was evaluated again by optometry on 12/21/13. She was found to still have bilateral papilledema. Her weight is down 10 lbs since her last appointment.   Update 04/10/14. The dose of Topamax was increased to 300 mg daily at the last appointment. She had a repeat LP on 03/08/14. Her opening pressure was 29 which was down from 37 in 06/2013. The dose of Topamax was then increased to 400 mg daily. On 03/21/14 she had formal VF testing which showed evidence of apparent inferior binasal hemianopia. Her weight today in the office remains at 330 lbs.   Update 06/20/14. At the last appointment she was referred to Neurosurgery for evaluation regarding spinal fluid shunt. Unfortunately her current health insurance does not cover  surgical procedures. Today her weight is at 334 lbs. She denies having any headaches or visual changes.  Update 10/30/14. Today she complains of having blurred vision with frequent "flash headaches" that last only a few minutes. She has not had an eye exam since February of this year. She did not follow up with Neurosurgery because her insurance at that time would not pay for surgery. She recently got a new job and now has Medicaid. She continues to take Topamax and Diamox as prescribed.   Update 03/24/16. It has been over a year since patient was last seen in the office. Patient had an eye exam yesterday. Notes from [facility] care reviewed. Exam revealed papilledema in both eyes. She reports stopping both Topamax and Diamox about a year ago due to costs. Her health insurance does not cover medications or office visits. Patient has also gained approximately 70 lbs since her last visit. She does not cook much and eats take out most of the time."   REVIEW OF SYSTEMS: Full 14 system review of systems performed and negative with exception of: Headache sleepiness snoring swelling legs  cough feeling cold itching.  Diabetes.  Heart failure.  ALLERGIES: No Known Allergies  HOME MEDICATIONS: Outpatient Medications Prior to Visit  Medication Sig Dispense Refill  . carvedilol (COREG) 3.125 MG tablet Take 3.125 mg by mouth 2 (two) times daily.  3  . CINNAMON PO Take 1,000 mg by mouth daily.    . furosemide (LASIX) 20 MG tablet Take 20 mg by mouth daily.    . furosemide (LASIX) 40 MG tablet Take 40 mg by mouth daily. May increase to 60mg  if weight up 3 pounds    . glipiZIDE (GLUCOTROL XL) 5 MG 24 hr tablet Take 5 mg by mouth daily with breakfast.    . lisinopril (PRINIVIL,ZESTRIL) 5 MG tablet Take 5 mg by mouth daily.  6  . lovastatin (MEVACOR) 40 MG tablet Take 40 mg by mouth daily.    . Omega-3 Fatty Acids (FISH OIL) 1000 MG CAPS Take 3,000 mg by mouth daily.     . SitaGLIPtin-MetFORMIN HCl (JANUMET XR)  50-1000 MG TB24 Take 1 tablet by mouth daily.    Marland Kitchen triamcinolone ointment (KENALOG) 0.5 % Apply 1 application topically 2 (two) times daily.   5  . acetaZOLAMIDE (DIAMOX) 250 MG tablet Take 2 tablets (500 mg total) by mouth 2 (two) times daily. (Patient not taking: Reported on 09/02/2017) 120 tablet 0  . lovastatin (MEVACOR) 20 MG tablet Take 40 mg by mouth daily.     No facility-administered medications prior to visit.     PAST MEDICAL HISTORY: Past Medical History:  Diagnosis Date  . CHF (congestive heart failure) (HCC)   . Diabetes mellitus without complication (HCC)   . Numbness in feet     PAST SURGICAL HISTORY: Past Surgical History:  Procedure Laterality Date  . TOOTH EXTRACTION    . TUBAL LIGATION      FAMILY HISTORY: Family History  Problem Relation Age of Onset  . Cancer Father     SOCIAL HISTORY:  Social History   Socioeconomic History  . Marital status: Single    Spouse name: Not on file  . Number of children: Not on file  . Years of education: Not on file  . Highest education level: Not on file  Occupational History  . Not on file  Social Needs  . Financial resource strain: Not on file  . Food insecurity:    Worry: Not on file    Inability: Not on file  . Transportation needs:    Medical: Not on file    Non-medical: Not on file  Tobacco Use  . Smoking status: Former Games developer  . Smokeless tobacco: Never Used  Substance and Sexual Activity  . Alcohol use: Not Currently  . Drug use: Never  . Sexual activity: Not on file  Lifestyle  . Physical activity:    Days per week: Not on file    Minutes per session: Not on file  . Stress: Not on file  Relationships  . Social connections:    Talks on phone: Not on file    Gets together: Not on file    Attends religious service: Not on file    Active member of club or organization: Not on file    Attends meetings of clubs or organizations: Not on file    Relationship status: Not on file  . Intimate  partner violence:    Fear of current or ex partner: Not on file    Emotionally abused: Not on file    Physically abused: Not on file  Forced sexual activity: Not on file  Other Topics Concern  . Not on file  Social History Narrative  . Not on file     PHYSICAL EXAM  GENERAL EXAM/CONSTITUTIONAL: Vitals:  Vitals:   09/02/17 0825  BP: 125/61  Pulse: 85  Weight: (!) 363 lb (164.7 kg)  Height: 5\' 10"  (1.778 m)     Body mass index is 52.09 kg/m. No exam data present  Patient is in no distress; well developed, nourished and groomed; neck is supple  ON O2 VIA [place]  CARDIOVASCULAR:  Examination of carotid arteries is normal; no carotid bruits  Regular rate and rhythm, no murmurs  Examination of peripheral vascular system by observation and palpation is normal  EYES:  Ophthalmoscopic exam of optic discs and posterior segments is NOTABLE FOR BLURRED OPTIC DISC MARGINS  MUSCULOSKELETAL:  Gait, strength, tone, movements noted in Neurologic exam below  NEUROLOGIC: MENTAL STATUS:  No flowsheet data found.  awake, alert, oriented to person, place and time  recent and remote memory intact  normal attention and concentration  language fluent, comprehension intact, naming intact,   fund of knowledge appropriate  CRANIAL NERVE:   2nd - BILATERAL PAPILLEDEMA  2nd, 3rd, 4th, 6th - pupils equal and SLUGGISH to light, visual fields full to confrontation, extraocular muscles intact, no nystagmus; ABLE TO COUNT FINGERS AT 2 FEET; DECR SENS TO LIGHT IN RIGHT EYE  5th - facial sensation symmetric  7th - facial strength symmetric  8th - hearing intact  9th - palate elevates symmetrically, uvula midline  11th - shoulder shrug symmetric  12th - tongue protrusion midline  MOTOR:   normal bulk and tone, full strength in the BUE, BLE  SENSORY:   normal and symmetric to light touch, temperature, vibration  COORDINATION:   finger-nose-finger, fine finger  movements normal  REFLEXES:   deep tendon reflexes TRACE and symmetric; ABSENT AT ANKLES  GAIT/STATION:   narrow based gait; able to walk on toes, heels and tandem; romberg is negative    DIAGNOSTIC DATA (LABS, IMAGING, TESTING) - I reviewed patient records, labs, notes, testing and imaging myself where available.  Lab Results  Component Value Date   WBC 8.4 08/30/2017   HGB 11.8 (L) 08/30/2017   HCT 37.3 08/30/2017   MCV 89.4 08/30/2017   PLT 237 08/30/2017      Component Value Date/Time   NA 137 08/30/2017 1937   NA 137 08/23/2017 1113   K 4.3 08/30/2017 1937   CL 92 (L) 08/30/2017 1937   CO2 34 (H) 08/30/2017 1937   GLUCOSE 163 (H) 08/30/2017 1937   BUN 34 (H) 08/30/2017 1937   BUN 24 08/23/2017 1113   CREATININE 1.39 (H) 08/30/2017 1937   CALCIUM 9.3 08/30/2017 1937   GFRNONAA 43 (L) 08/30/2017 1937   GFRAA 50 (L) 08/30/2017 1937   No results found for: CHOL, HDL, LDLCALC, LDLDIRECT, TRIG, CHOLHDL No results found for: [id number] No results found for: VITAMINB12 No results found for: TSH   08/30/17 CT head [I reviewed images myself and agree with interpretation. -VRP]  - Stigmata of idiopathic intracranial hypertension with slight kinking of the optic nerves bilaterally, flattening of the posterior aspect of the globes and anterior partially empty appearing pituitary sella.     ASSESSMENT AND PLAN  52 y.o. year old female here with history of pseudotumor cerebri since 2004, with worsening symptoms in the last 3 months due to financial limitations and not taking her medications.  Patient having transient visual obscurations.  Dx:  1. Papilledema associated with increased intracranial pressure   2. Vision loss, bilateral   3. IIH (idiopathic intracranial hypertension)   4. Morbid obesity (HCC)   5. Obstructive sleep apnea   6. Chronic diastolic congestive heart failure (HCC)      PLAN:  IIH / VISION LOSS (new problem, urgent treatment needed) - start  acetazolamide ER 500mg  twice a day  - refer back to neurosurgery (Dr. Petra Kuba, [facility]ches) for consideration of shunt due to severe symptoms and to avoid permanent vision loss - follow up with ophthalmology [facility]) - follow up with PCP, cardiology  Orders Placed This Encounter  Procedures  . Ambulatory referral to Neurosurgery   Meds ordered this encounter  Medications  . acetaZOLAMIDE (DIAMOX) 500 MG capsule    Sig: Take 1 capsule (500 mg total) by mouth 2 (two) times daily.    Dispense:  60 capsule    Refill:  12   Return in about 3 months (around 12/03/2017).  I reviewed images, labs, notes, records myself. I summarized findings and reviewed with patient, for this high risk condition (vision loss) requiring high complexity decision making.     Suanne Marker, MD 09/02/2017, 8:38 AM Certified in Neurology, Neurophysiology and Neuroimaging  [facility] [address], [address] [place], [place] [address] [phone]

## 2017-09-02 NOTE — Telephone Encounter (Signed)
I called [facility] again. No answer again. Will try again later.

## 2017-09-05 NOTE — Telephone Encounter (Addendum)
Called [facility]  I was on Hold will try again tomorrow.

## 2017-09-06 NOTE — Telephone Encounter (Addendum)
[facility] . Patient called and CX apt . [facility]. Marjory LiesPenumalli.

## 2017-09-24 ENCOUNTER — Ambulatory Visit (HOSPITAL_BASED_OUTPATIENT_CLINIC_OR_DEPARTMENT_OTHER): Payer: PRIVATE HEALTH INSURANCE | Attending: Cardiology | Admitting: Cardiovascular Disease

## 2017-09-24 DIAGNOSIS — G4736 Sleep related hypoventilation in conditions classified elsewhere: Secondary | ICD-10-CM | POA: Diagnosis present

## 2017-09-24 DIAGNOSIS — G4733 Obstructive sleep apnea (adult) (pediatric): Secondary | ICD-10-CM | POA: Insufficient documentation

## 2017-09-24 DIAGNOSIS — I272 Pulmonary hypertension, unspecified: Secondary | ICD-10-CM

## 2017-09-24 DIAGNOSIS — Z79899 Other long term (current) drug therapy: Secondary | ICD-10-CM | POA: Insufficient documentation

## 2017-09-24 DIAGNOSIS — Z7984 Long term (current) use of oral hypoglycemic drugs: Secondary | ICD-10-CM | POA: Diagnosis not present

## 2017-09-24 DIAGNOSIS — R0902 Hypoxemia: Secondary | ICD-10-CM | POA: Diagnosis not present

## 2017-09-29 NOTE — Telephone Encounter (Signed)
Patient called and requested the information for the referral for her shunt. I gave her the information to Dr. Junius RoadsKilpatricks office including phone and address. She said she would call and make an apt with them.

## 2017-10-03 ENCOUNTER — Encounter (HOSPITAL_BASED_OUTPATIENT_CLINIC_OR_DEPARTMENT_OTHER): Payer: Self-pay | Admitting: Cardiovascular Disease

## 2017-10-03 NOTE — Procedures (Signed)
Patient Name: Emily Mooney, Emily Mooney Study Date: 09/24/2017 Gender: Female D.O.B: [birth year] Age (years): 52 Referring Provider: Georgeanna Leaobert J Krasowski Height (inches): 70 Interpreting Physician: Nicki Guadalajarahomas Kelly MD, ABSM Weight (lbs): 361 RPSGT: Rolene ArbourMcConnico, Yvonne BMI: 52 MRN: [id number] Neck Size: 18.00  CLINICAL INFORMATION Sleep Study Type: NPSG  Indication for sleep study: Loud snoring, apnea, hypersomnolence  Epworth Sleepiness Score: 12  SLEEP STUDY TECHNIQUE As per the AASM Manual for the Scoring of Sleep and Associated Events v2.3 (April 2016) with a hypopnea requiring 4% desaturations.  The channels recorded and monitored were frontal, central and occipital EEG, electrooculogram (EOG), submentalis EMG (chin), nasal and oral airflow, thoracic and abdominal wall motion, anterior tibialis EMG, snore microphone, electrocardiogram, and pulse oximetry.  MEDICATIONS     acetaZOLAMIDE (DIAMOX) 500 MG capsule             carvedilol (COREG) 3.125 MG tablet         CINNAMON PO         furosemide (LASIX) 20 MG tablet         furosemide (LASIX) 40 MG tablet         glipiZIDE (GLUCOTROL XL) 5 MG 24 hr tablet           lisinopril (PRINIVIL,ZESTRIL) 5 MG tablet         lovastatin (MEVACOR) 40 MG tablet         Omega-3 Fatty Acids (FISH OIL) 1000 MG CAPS         SitaGLIPtin-MetFORMIN HCl (JANUMET XR) 50-1000 MG TB24         triamcinolone ointment (KENALOG) 0.5 %      Medications self-administered by patient taken the night of the study : N/A  SLEEP ARCHITECTURE The study was initiated at 10:42:26 PM and ended at 4:56:55 AM.  Sleep onset time was 65.9 minutes and the sleep efficiency was 32.4%%. The total sleep time was 121.5 minutes.  Stage REM latency was N/A minutes.  The patient spent 5.3%% of the night in stage N1 sleep, 94.7%% in stage N2 sleep, 0.0%% in stage N3 and 0% in REM.  Alpha intrusion was absent.  Supine sleep was 79.35%.  RESPIRATORY PARAMETERS The overall  apnea/hypopnea index (AHI) was 20.2 per hour. The respiratory disturbance index (RDI) was 22.2/h. There were 0 total apneas, including 0 obstructive, 0 central and 0 mixed apneas. There were 41 hypopneas and 4 RERAs.  The AHI during Stage REM sleep was N/A per hour.  AHI while supine was 25.5 per hour.  The mean oxygen saturation was 89.6%. The minimum SpO2 during sleep was 73.0%.  Loud snoring was noted during this study.  CARDIAC DATA The 2 lead EKG demonstrated sinus rhythm. The mean heart rate was 77.3 beats per minute. Other EKG findings include: None.  LEG MOVEMENT DATA The total PLMS were 0 with a resulting PLMS index of 0.0. Associated arousal with leg movement index was 1.0 .  IMPRESSIONS - Moderate obstructive sleep apnea occurred during this study (AHI 20.2/h; RDI 22.2/h).  Events wer worse with supine position (AHI 25.5/h) However, there was absence of REM sleep and the overall severity may be underestimated if REM sleep had ocurred. - No significant central sleep apnea occurred during this study (CAI = 0.0/h). - Significant oxygen desaturation to a nadir of 73%. - Significantly reduced sleep efficiency at 32.4%. - Abnormal sleep architecture with absence of slow wave and REM sleep. - The patient snored with loud snoring volume. - No cardiac abnormalities were  noted during this study. - Clinically significant periodic limb movements did not occur during sleep. No significant associated arousals.  DIAGNOSIS - Obstructive Sleep Apnea (327.23 [G47.33 ICD-10]) - Nocturnal Hypoxemia (327.26 [G47.36 ICD-10])  RECOMMENDATIONS - Therapeutic CPAP titration to determine optimal pressure required to alleviate sleep disordered breathing. - Efforts should be made to optimize nasal and oropharyngeal patency. - Positional therapy avoiding supine position during sleep. - Avoid alcohol, sedatives and other CNS depressants that may worsen sleep apnea and disrupt normal sleep  architecture. - Sleep hygiene should be reviewed to assess factors that may improve sleep quality. - Weight management (BMI 52) and regular exercise should be initiated or continued if appropriate.  [Electronically signed] 10/03/2017 05:10 PM  Nicki Guadalajarahomas Kelly MD, [facility] HospitalFACC, ABSM Diplomate, American Board of Sleep Medicine   NPI: [id number] [facility] PH: [phone]   FX: [phone]) [phone] ACCREDITED BY THE AMERICAN ACADEMY OF SLEEP MEDICINE

## 2017-10-04 ENCOUNTER — Encounter: Payer: Self-pay | Admitting: Cardiology

## 2017-10-04 ENCOUNTER — Ambulatory Visit (INDEPENDENT_AMBULATORY_CARE_PROVIDER_SITE_OTHER): Payer: PRIVATE HEALTH INSURANCE | Admitting: Cardiology

## 2017-10-04 VITALS — BP 124/68 | HR 76 | Ht 70.0 in | Wt 359.0 lb

## 2017-10-04 DIAGNOSIS — G4733 Obstructive sleep apnea (adult) (pediatric): Secondary | ICD-10-CM

## 2017-10-04 DIAGNOSIS — I272 Pulmonary hypertension, unspecified: Secondary | ICD-10-CM | POA: Diagnosis not present

## 2017-10-04 DIAGNOSIS — I10 Essential (primary) hypertension: Secondary | ICD-10-CM

## 2017-10-04 DIAGNOSIS — I5032 Chronic diastolic (congestive) heart failure: Secondary | ICD-10-CM

## 2017-10-04 LAB — BASIC METABOLIC PANEL
BUN/Creatinine Ratio: 22 (ref 9–23)
BUN: 28 mg/dL — AB (ref 6–24)
CALCIUM: 9.5 mg/dL (ref 8.7–10.2)
CHLORIDE: 101 mmol/L (ref 96–106)
CO2: 21 mmol/L (ref 20–29)
Creatinine, Ser: 1.28 mg/dL — ABNORMAL HIGH (ref 0.57–1.00)
GFR calc Af Amer: 56 mL/min/{1.73_m2} — ABNORMAL LOW (ref >59)
GFR, EST NON AFRICAN AMERICAN: 49 mL/min/{1.73_m2} — AB (ref >59)
Glucose: 141 mg/dL — ABNORMAL HIGH (ref 65–99)
POTASSIUM: 4.3 mmol/L (ref 3.5–5.2)
Sodium: 138 mmol/L (ref 134–144)

## 2017-10-04 LAB — PRO B NATRIURETIC PEPTIDE: NT-Pro BNP: 51 pg/mL (ref 0–249)

## 2017-10-04 NOTE — Progress Notes (Signed)
Cardiology Office Note:    Date:  10/04/2017   ID:  Emily Maizeona Fishman, DOB 1965/03/19, MRN [id number]  PCP:  Wilmer Floorampbell, Stephen D., MD  Cardiologist:  Gypsy Balsamobert Krasowski, MD    Referring MD: Wilmer Floorampbell, Stephen D., MD   No chief complaint on file. I am doing better  History of Present Illness:    Emily Mooney is a 52 y.o. female with diastolic congestive heart rate, pulmonary hypertension, diabetes.  Comes to my office for follow-up she did have a sleep study apparently she slept only 2 hours.  I do not have results of the study as he was told that she required another test.  She is keeping up with her weight and her weight is stable and actually dropped being slowly.  She is on 60 mg furosemide every single day.  Past Medical History:  Diagnosis Date  . CHF (congestive heart failure) (HCC)   . Diabetes mellitus without complication (HCC)   . Numbness in feet     Past Surgical History:  Procedure Laterality Date  . TOOTH EXTRACTION    . TUBAL LIGATION      Current Medications: Current Meds  Medication Sig  . acetaZOLAMIDE (DIAMOX) 250 MG tablet Take 500 mg by mouth 2 (two) times daily.  Marland Kitchen. acetaZOLAMIDE (DIAMOX) 500 MG capsule Take 1 capsule (500 mg total) by mouth 2 (two) times daily.  . carvedilol (COREG) 3.125 MG tablet Take 3.125 mg by mouth 2 (two) times daily.  Marland Kitchen. CINNAMON PO Take 1,000 mg by mouth daily.  . furosemide (LASIX) 20 MG tablet Take 20 mg by mouth daily.  . furosemide (LASIX) 40 MG tablet Take 40 mg by mouth daily. May increase to 60mg  if weight up 3 pounds  . glipiZIDE (GLUCOTROL XL) 5 MG 24 hr tablet Take 5 mg by mouth daily with breakfast.  . lisinopril (PRINIVIL,ZESTRIL) 5 MG tablet Take 5 mg by mouth daily.  Marland Kitchen. lovastatin (MEVACOR) 40 MG tablet Take 40 mg by mouth daily.  . Omega-3 Fatty Acids (FISH OIL) 1000 MG CAPS Take 3,000 mg by mouth daily.   Marland Kitchen. triamcinolone ointment (KENALOG) 0.5 % Apply 1 application topically 2 (two) times daily.      Allergies:    Patient has no known allergies.   Social History   Socioeconomic History  . Marital status: Single    Spouse name: Not on file  . Number of children: Not on file  . Years of education: Not on file  . Highest education level: Not on file  Occupational History  . Not on file  Social Needs  . Financial resource strain: Not on file  . Food insecurity:    Worry: Not on file    Inability: Not on file  . Transportation needs:    Medical: Not on file    Non-medical: Not on file  Tobacco Use  . Smoking status: Former Games developermoker  . Smokeless tobacco: Never Used  Substance and Sexual Activity  . Alcohol use: Not Currently  . Drug use: Never  . Sexual activity: Not on file  Lifestyle  . Physical activity:    Days per week: Not on file    Minutes per session: Not on file  . Stress: Not on file  Relationships  . Social connections:    Talks on phone: Not on file    Gets together: Not on file    Attends religious service: Not on file    Active member of club or organization: Not on file  Attends meetings of clubs or organizations: Not on file    Relationship status: Not on file  Other Topics Concern  . Not on file  Social History Narrative  . Not on file     Family History: The patient's family history includes Cancer in her father. ROS:   Please see the history of present illness.    All 14 point review of systems negative except as described per history of present illness  EKGs/Labs/Other Studies Reviewed:      Recent Labs: 08/23/2017: NT-Pro BNP 125 08/30/2017: BUN 34; Creatinine, Ser 1.39; Hemoglobin 11.8; Platelets 237; Potassium 4.3; Sodium 137  Recent Lipid Panel No results found for: CHOL, TRIG, HDL, CHOLHDL, VLDL, LDLCALC, LDLDIRECT  Physical Exam:    VS:  BP 124/68 (BP Location: Right Arm, Patient Position: Sitting, Cuff Size: Normal)   Pulse 76   Ht 5\' 10"  (1.778 m)   Wt (!) 359 lb (162.8 kg)   SpO2 96%   BMI 51.51 kg/m     Wt Readings from Last 3  Encounters:  10/04/17 (!) 359 lb (162.8 kg)  09/24/17 (!) 361 lb (163.7 kg)  09/02/17 (!) 363 lb (164.7 kg)     GEN:  Well nourished, well developed in no acute distress HEENT: Normal NECK: No JVD; No carotid bruits LYMPHATICS: No lymphadenopathy CARDIAC: RRR, no murmurs, no rubs, no gallops RESPIRATORY:  Clear to auscultation without rales, wheezing or rhonchi  ABDOMEN: Soft, non-tender, non-distended MUSCULOSKELETAL:  No edema; No deformity  SKIN: Warm and dry LOWER EXTREMITIES: no swelling NEUROLOGIC:  Alert and oriented x 3 PSYCHIATRIC:  Normal affect   ASSESSMENT:    1. Chronic diastolic congestive heart failure (HCC)   2. Essential hypertension   3. Pulmonary HTN (HCC)   4. Obstructive sleep apnea   5. Morbid obesity (HCC)    PLAN:    In order of problems listed above:  1. Chronic diastolic congestive heart failure.  Appears to be compensated I will check Chem-7 as well as proBNP today. 2. Pulmonary hypertension.  She does have baseline hypoxia.  She also had sleep study which both understand is not complete.  I will reschedule her to have a repeated sleep study I will schedule her to see pulmonology. 3. Obstructive sleep apnea again we are waiting for sleep study. 4. Morbid obesity: No documented by this month if she lose some weight it will help significantly with all the problems.  See her back in my office in 2 months sooner if she get a problem   Medication Adjustments/Labs and Tests Ordered: Current medicines are reviewed at length with the patient today.  Concerns regarding medicines are outlined above.  No orders of the defined types were placed in this encounter.  Medication changes: No orders of the defined types were placed in this encounter.   Signed, Georgeanna Leaobert J. Krasowski, MD, [facility] Center[facility]ACC 10/04/2017 10:54 AM    [facility]

## 2017-10-04 NOTE — Patient Instructions (Addendum)
Medication Instructions:  Your physician recommends that you continue on your current medications as directed. Please refer to the Current Medication list given to you today.  Labwork: None  Testing/Procedures: None  Follow-Up: Your physician recommends that you schedule a follow-up appointment in: 2 months  Any Other Special Instructions Will Be Listed Below (If Applicable).   Referral Orders     Ambulatory referral to Pulmonology Their office should call you in 1 week, if they do not please contact their office.   If you need a refill on your cardiac medications before your next appointment, please call your pharmacy.   [facility]  Garey HamAshley A, RN, BSN

## 2017-10-04 NOTE — Telephone Encounter (Signed)
Patient Is scheduled with Dr. Petra KubaKilpatrick in Sept. I have talked to the patient.

## 2017-10-05 ENCOUNTER — Telehealth: Payer: Self-pay | Admitting: *Deleted

## 2017-10-05 DIAGNOSIS — G4733 Obstructive sleep apnea (adult) (pediatric): Secondary | ICD-10-CM

## 2017-10-05 NOTE — Progress Notes (Signed)
See telephone note-patient aware and verbalized understanding.  Order placed and message sent to precert

## 2017-10-05 NOTE — Telephone Encounter (Signed)
Called patient, discussed results of sleep study and recommendations for CPAP titration.  Patient aware and states she has used CPAP in the past.      Order placed and sent to precert.  Advised once approved by insurance, will call to schedule.  Pt verbalized understanding.

## 2017-10-05 NOTE — Telephone Encounter (Signed)
-----   Message from Lennette Biharihomas A Kelly, MD sent at 10/03/2017  5:16 PM EDT ----- Concha PyoWanda/Maryclare Nydam; please notify pt and set up for CPAP titration study.

## 2017-10-05 NOTE — Telephone Encounter (Signed)
Per [facility], patient's insurance "termed" on 08/31/2017.  Call to patient who confirmed she is currently uninsured and working on "getting disability."  Pt aware she will be receiving a call back from our office to discuss available options for CPAP titration.

## 2017-10-12 ENCOUNTER — Telehealth: Payer: Self-pay | Admitting: *Deleted

## 2017-10-12 DIAGNOSIS — G4733 Obstructive sleep apnea (adult) (pediatric): Secondary | ICD-10-CM

## 2017-10-12 NOTE — Telephone Encounter (Signed)
-----   Message from Patricia Pesaeri D Suits, RN sent at 10/05/2017  2:56 PM EDT ----- Regarding: FW: CPAP titration Per [facility], patient's insurance "termed" on 08/31/2017.  Call to patient who confirmed she is currently uninsured and working on "getting disability."  Pt aware she will be receiving a call back from our office to discuss available options for CPAP titration.  ----- Message ----- From: Harvel RicksSharpe, Hayley A, RN Sent: 10/05/2017   9:27 AM EDT To: Cv Div Sleep Studies Subject: CPAP titration                                 CPAP titration ordered, please pre cert.   Thanks!

## 2017-10-12 NOTE — Telephone Encounter (Signed)
The patient needs a CPAP titration study based on her abnormal diagnostic polysomnogram.

## 2017-10-12 NOTE — Telephone Encounter (Signed)
Patient is uninsured and does not want to private pay for her cpap titration. Patient wants to wait on getting social security disability. Thanks, Veda CanningNina Trayven Lumadue, CMA Sleep Coordinator

## 2017-10-27 ENCOUNTER — Telehealth: Payer: Self-pay | Admitting: Pulmonary Disease

## 2017-10-27 NOTE — Telephone Encounter (Signed)
This patient has never been seen by our office I see where Dr. Nicki Guadalajarahomas Kelly put in a CPAP Titration study order on 10/05/2017 and on 08/23/2017 Dr. Gypsy Balsamobert Krasowski ordered split night sleep study

## 2017-10-27 NOTE — Telephone Encounter (Signed)
Spoke with patient, patient wanted to be seen as a consult, appointments offered, patient stated she did not want to wait and she would call her cardiologist to see if she could be referred some where else to be seen sooner. Nothing further needed at this time.

## 2017-10-27 NOTE — Telephone Encounter (Signed)
Didn't mean to close out this message

## 2017-10-28 NOTE — Telephone Encounter (Addendum)
cpap titration ordered by Johney FrameHayley Sharpe. See below.

## 2017-10-31 ENCOUNTER — Ambulatory Visit: Payer: PRIVATE HEALTH INSURANCE | Admitting: Neurology

## 2017-11-11 ENCOUNTER — Encounter: Payer: Self-pay | Admitting: Pulmonary Disease

## 2017-11-11 ENCOUNTER — Ambulatory Visit (INDEPENDENT_AMBULATORY_CARE_PROVIDER_SITE_OTHER): Payer: Medicaid Other | Admitting: Pulmonary Disease

## 2017-11-11 ENCOUNTER — Ambulatory Visit: Payer: Medicaid Other | Admitting: *Deleted

## 2017-11-11 VITALS — BP 128/64 | HR 76 | Ht 70.0 in | Wt 363.0 lb

## 2017-11-11 DIAGNOSIS — I272 Pulmonary hypertension, unspecified: Secondary | ICD-10-CM | POA: Diagnosis not present

## 2017-11-11 DIAGNOSIS — J9611 Chronic respiratory failure with hypoxia: Secondary | ICD-10-CM

## 2017-11-11 DIAGNOSIS — I5032 Chronic diastolic (congestive) heart failure: Secondary | ICD-10-CM | POA: Diagnosis not present

## 2017-11-11 DIAGNOSIS — G4733 Obstructive sleep apnea (adult) (pediatric): Secondary | ICD-10-CM

## 2017-11-11 LAB — PULMONARY FUNCTION TEST
DL/VA % PRED: 84 %
DL/VA: 4.5 ml/min/mmHg/L
DLCO unc % pred: 59 %
DLCO unc: 18.44 ml/min/mmHg
FEF 25-75 POST: 3.05 L/s
FEF 25-75 Pre: 2.76 L/sec
FEF2575-%CHANGE-POST: 10 %
FEF2575-%Pred-Post: 101 %
FEF2575-%Pred-Pre: 91 %
FEV1-%Change-Post: 4 %
FEV1-%PRED-PRE: 67 %
FEV1-%Pred-Post: 70 %
FEV1-Post: 2.3 L
FEV1-Pre: 2.2 L
FEV1FVC-%CHANGE-POST: -3 %
FEV1FVC-%Pred-Pre: 107 %
FEV6-%Change-Post: 7 %
FEV6-%PRED-PRE: 63 %
FEV6-%Pred-Post: 68 %
FEV6-PRE: 2.57 L
FEV6-Post: 2.75 L
FEV6FVC-%PRED-PRE: 103 %
FEV6FVC-%Pred-Post: 103 %
FVC-%CHANGE-POST: 8 %
FVC-%PRED-PRE: 62 %
FVC-%Pred-Post: 67 %
FVC-POST: 2.8 L
FVC-PRE: 2.57 L
POST FEV1/FVC RATIO: 82 %
POST FEV6/FVC RATIO: 100 %
Pre FEV1/FVC ratio: 86 %
Pre FEV6/FVC Ratio: 100 %
RV % pred: 98 %
RV: 2.03 L
TLC % PRED: 78 %
TLC: 4.57 L

## 2017-11-11 NOTE — Progress Notes (Signed)
   Subjective:    Patient ID: Emily Mooney, female    DOB: March 28, 1965, 52 y.o.   MRN: [id number]  HPI    Review of Systems  Constitutional: Negative for fever and unexpected weight change.  HENT: Negative for congestion, dental problem, ear pain, nosebleeds, postnasal drip, rhinorrhea, sinus pressure, sneezing, sore throat and trouble swallowing.   Eyes: Negative for redness and itching.  Respiratory: Positive for shortness of breath. Negative for cough, chest tightness and wheezing.   Cardiovascular: Negative for palpitations and leg swelling.  Gastrointestinal: Negative for nausea and vomiting.  Genitourinary: Negative for dysuria.  Musculoskeletal: Negative for joint swelling.  Skin: Negative for rash.  Neurological: Negative for headaches.  Hematological: Does not bruise/bleed easily.  Psychiatric/Behavioral: Negative for dysphoric mood. The patient is not nervous/anxious.        Objective:   Physical Exam        Assessment & Plan:

## 2017-11-11 NOTE — Progress Notes (Signed)
Synopsis: Referred in Sept 2019 for Pulmonary Hypertension in the setting of obstructive sleep apnea, chronic respiratory failure with hypoxemia, and a prior heavy smoking history.  She smokes 1/2 packs of cigarettes daily for 24 years, quit at age 52. Subjective:   PATIENT ID: Emily Mooney GENDER: female DOB: April 06, 1965, MRN: [id number]   HPI  Chief Complaint  Patient presents with  . Pulm Consult    Referred by Dr. Kirtland Bouchard in [place] for history of pulmonary HTN. She is currently on 2L of O2. She has been on O2 since June 2019.     Chronic respiratory failure with hypoxemia: > she has been told she has sleep apnea > she sleeps with oxygen > she has been using oxygen since June of this year.   Dyspnea: > if she walks too far outside in the heat she gets dyspnea > she says this started in July > she has always had some exertional dyspnea > howver in June she was hospitalized for a heart failure exacerbation > she lost 53 pounds in June after diuresis > she says that her weight was 453 when she was admitted in June, now she now weighs 363 pounds  She says that she is careful about keeping her fluid intake less than 2 liters and 2 gm.  She takes her diuretics. She didn't have asthma or pneumonia as a child.  She doesn't have any family members with lung problems.  She smoked 1.5ppd from age 77 to age 4.   She coughs some if she is off her oxygen.  She hasn't had had bronchitis in two years.  She works as a Lawyer.  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    Obstructive sleep apnea: She says that she has been diagnosed with this in the past, she took CPAP for a number of years.  When she tried to have this renewed recently she was told she needed to go for another sleep study.  During the sleep study she did not sleep very much.  Past Medical History:  Diagnosis Date  . CHF (congestive heart failure) (HCC)   . Diabetes mellitus without  complication (HCC)   . Numbness in feet      Family History  Problem Relation Age of Onset  . Cancer Father      Social History   Socioeconomic History  . Marital status: Single    Spouse name: Not on file  . Number of children: Not on file  . Years of education: Not on file  . Highest education level: Not on file  Occupational History  . Not on file  Social Needs  . Financial resource strain: Not on file  . Food insecurity:    Worry: Not on file    Inability: Not on file  . Transportation needs:    Medical: Not on file    Non-medical: Not on file  Tobacco Use  . Smoking status: Former Games developer  . Smokeless tobacco: Never Used  Substance and Sexual Activity  . Alcohol use: Not Currently  . Drug use: Never  . Sexual activity: Not on file  Lifestyle  . Physical activity:    Days per week: Not on file    Minutes per session: Not on file  . Stress: Not on file  Relationships  . Social connections:    Talks on phone: Not on file    Gets together: Not on file  Attends religious service: Not on file    Active member of club or organization: Not on file    Attends meetings of clubs or organizations: Not on file    Relationship status: Not on file  . Intimate partner violence:    Fear of current or ex partner: Not on file    Emotionally abused: Not on file    Physically abused: Not on file    Forced sexual activity: Not on file  Other Topics Concern  . Not on file  Social History Narrative  . Not on file     Allergies  Allergen Reactions  . Depo-Medrol [Methylprednisolone] Anaphylaxis     Outpatient Medications Prior to Visit  Medication Sig Dispense Refill  . acetaZOLAMIDE (DIAMOX) 500 MG capsule Take 1 capsule (500 mg total) by mouth 2 (two) times daily. 60 capsule 12  . carvedilol (COREG) 3.125 MG tablet Take 3.125 mg by mouth 2 (two) times daily.  3  . CINNAMON PO Take 1,000 mg by mouth daily.    . furosemide (LASIX) 20 MG tablet Take 20 mg by mouth  daily.    . furosemide (LASIX) 40 MG tablet Take 40 mg by mouth daily. May increase to 60mg  if weight up 3 pounds    . glipiZIDE (GLUCOTROL XL) 5 MG 24 hr tablet Take 5 mg by mouth daily with breakfast.    . lisinopril (PRINIVIL,ZESTRIL) 5 MG tablet Take 5 mg by mouth daily.  6  . lovastatin (MEVACOR) 40 MG tablet Take 40 mg by mouth daily.    . Omega-3 Fatty Acids (FISH OIL) 1000 MG CAPS Take 3,000 mg by mouth daily.     Marland Kitchen triamcinolone ointment (KENALOG) 0.5 % Apply 1 application topically 2 (two) times daily.   5  . acetaZOLAMIDE (DIAMOX) 250 MG tablet Take 500 mg by mouth 2 (two) times daily.  0   No facility-administered medications prior to visit.     Review of Systems  Constitutional: Negative for chills, fever, malaise/fatigue and weight loss.  HENT: Negative for congestion, nosebleeds, sinus pain and sore throat.   Eyes: Negative for photophobia, pain and discharge.  Respiratory: Positive for shortness of breath. Negative for cough, hemoptysis, sputum production and wheezing.   Cardiovascular: Negative for chest pain, palpitations, orthopnea and leg swelling.  Gastrointestinal: Negative for abdominal pain, constipation, diarrhea, nausea and vomiting.  Genitourinary: Negative for dysuria, frequency, hematuria and urgency.  Musculoskeletal: Negative for back pain, joint pain, myalgias and neck pain.  Skin: Negative for itching and rash.  Neurological: Negative for tingling, tremors, sensory change, speech change, focal weakness, seizures, weakness and headaches.  Psychiatric/Behavioral: Negative for memory loss, substance abuse and suicidal ideas. The patient is not nervous/anxious.       Objective:  Physical Exam   Vitals:   11/11/17 1030  BP: 128/64  Pulse: 76  SpO2: 93%  Weight: (!) 363 lb (164.7 kg)  Height: 5\' 10"  (1.778 m)   2L [place]  She walked 500 feet on room air in our office and her O2 saturation dropped to 86%  Gen: morbidly obese, chronically ill  appearing, no acute distress HENT: NCAT, OP clear, neck supple without masses Eyes: PERRL, EOMi Lymph: no cervical lymphadenopathy PULM: CTA B CV: RRR, no mgr, no JVD GI: BS+, soft, nontender, no hsm Derm: chronic venous stasis changes legs, chronic edema, no rash or skin breakdown MSK: normal bulk and tone Neuro: A&Ox4, CN II-XII intact, strength 5/5 in all 4 extremities Psyche: normal mood and  affect    CBC    Component Value Date/Time   WBC 8.4 08/30/2017 1937   RBC 4.17 08/30/2017 1937   HGB 11.8 (L) 08/30/2017 1937   HCT 37.3 08/30/2017 1937   PLT 237 08/30/2017 1937   MCV 89.4 08/30/2017 1937   MCH 28.3 08/30/2017 1937   MCHC 31.6 08/30/2017 1937   RDW 18.8 (H) 08/30/2017 1937   LYMPHSABS 1.8 08/30/2017 1937   MONOABS 0.5 08/30/2017 1937   EOSABS 0.4 08/30/2017 1937   BASOSABS 0.0 08/30/2017 1937     Chest imaging:  PFT:  Labs:  Path:  Echo: June 2019 transthoracic echocardiogram says technically difficult study, LVEF normal 60 to 65%, pseudo-normal LV filling pattern, mildly enlarged right ventricle with normal systolic function, left atrium dilated, moderate concentric LVH, moderate tricuspid regurgitation, RVSP is 59 mmHg echo shows evidence of increased right atrial pressure  Heart Catheterization:  Sleep study: 09/2017: attempted, didn't sleep well, but O2 saturation dropped < 88% for more than 15 minutes     Assessment & Plan:   Pulmonary HTN (HCC) - Plan: Pulmonary function test  Obstructive sleep apnea  Chronic diastolic congestive heart failure (HCC)  Chronic respiratory failure with hypoxia (HCC)  Discussion: 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  However, I question whether or not there could be some degree of COPD based on her extensive smoking history and history of recurrent bronchitis a few years ago.  On my personal review of the CT scan  of her chest from June 2018 there did seem to be some early emphysema findings.  Her pulmonary hypertension is due to [employer] group 2 and group 3 disease: Diastolic heart failure, obstructive sleep apnea, and possibly underlying lung disease.  We will test today to see if there is evidence of underlying lung disease.  The best pulmonary vasodilator she can take his oxygen so she needs to stay on this medicine as today's ambulatory oximetry test showed that she dropped to 86% on room air  These patients do not benefit from pulmonary vasodilators.  That being said, I suppose there is a less than 2% chance that she could have an underlying pulmonary arteriopathy independent of her pulmonary and cardiac disease so it will be important for [place] to monitor 6-minute walk test and echocardiogram results.  Plan: Pulmonary hypertension: This is the medical term that says that your blood pressure in your lungs is higher than it should be It is due to your heart failure and his sleep apnea We will check a 6-minute walk test when you come back to clinic and then we will follow this test over the next few months Were going to check a lung function test to make sure there is not evidence of another lung problem that is causing this  Obstructive sleep apnea: He need to have a CPAP titration study, I will reach out to the cardiology team to see if they have scheduled this  Diastolic heart failure: Keep taking the diuretic medicines and monitoring her fluid and sodium intake as you are doing  Shortness of breath with history of smoking: We will check a lung function test to make sure there is no evidence of an underlying lung disease  Chronic respiratory failure with hypoxemia: Keep using oxygen continuously as you are doing  We will see you back in 2 weeks to go over the results of this test, you can  either see me in [facility] or a nurse practitioner either here or in [facility].    Current Outpatient Medications:  .  acetaZOLAMIDE (DIAMOX) 500 MG capsule, Take 1 capsule (500 mg total) by mouth 2 (two) times daily., Disp: 60 capsule, Rfl: 12 .  carvedilol (COREG) 3.125 MG tablet, Take 3.125 mg by mouth 2 (two) times daily., Disp: , Rfl: 3 .  CINNAMON PO, Take 1,000 mg by mouth daily., Disp: , Rfl:  .  furosemide (LASIX) 20 MG tablet, Take 20 mg by mouth daily., Disp: , Rfl:  .  furosemide (LASIX) 40 MG tablet, Take 40 mg by mouth daily. May increase to 60mg  if weight up 3 pounds, Disp: , Rfl:  .  glipiZIDE (GLUCOTROL XL) 5 MG 24 hr tablet, Take 5 mg by mouth daily with breakfast., Disp: , Rfl:  .  lisinopril (PRINIVIL,ZESTRIL) 5 MG tablet, Take 5 mg by mouth daily., Disp: , Rfl: 6 .  lovastatin (MEVACOR) 40 MG tablet, Take 40 mg by mouth daily., Disp: , Rfl:  .  Omega-3 Fatty Acids (FISH OIL) 1000 MG CAPS, Take 3,000 mg by mouth daily. , Disp: , Rfl:  .  triamcinolone ointment (KENALOG) 0.5 %, Apply 1 application topically 2 (two) times daily. , Disp: , Rfl: 5

## 2017-11-11 NOTE — Patient Instructions (Signed)
Pulmonary hypertension: This is the medical term that says that your blood pressure in your lungs is higher than it should be It is due to your heart failure and his sleep apnea We will check a 6-minute walk test when you come back to clinic and then we will follow this test over the next few months Were going to check a lung function test to make sure there is not evidence of another lung problem that is causing this  Obstructive sleep apnea: He need to have a CPAP titration study, I will reach out to the cardiology team to see if they have scheduled this  Diastolic heart failure: Keep taking the diuretic medicines and monitoring her fluid and sodium intake as you are doing  Shortness of breath with history of smoking: We will check a lung function test to make sure there is no evidence of an underlying lung disease  Chronic respiratory failure with hypoxemia: Keep using oxygen continuously as you are doing  We will see you back in 2 weeks to go over the results of this test, you can either see me in [facility] or a nurse practitioner either here or in [facility].

## 2017-11-11 NOTE — Addendum Note (Signed)
Addended by: Maurene Capes on: 11/11/2017 11:30 AM   Modules accepted: Orders

## 2017-12-02 ENCOUNTER — Ambulatory Visit (INDEPENDENT_AMBULATORY_CARE_PROVIDER_SITE_OTHER): Payer: Medicaid Other | Admitting: *Deleted

## 2017-12-02 ENCOUNTER — Encounter: Payer: Self-pay | Admitting: Pulmonary Disease

## 2017-12-02 ENCOUNTER — Ambulatory Visit (INDEPENDENT_AMBULATORY_CARE_PROVIDER_SITE_OTHER): Payer: Medicaid Other | Admitting: Pulmonary Disease

## 2017-12-02 VITALS — BP 138/80 | HR 100 | Wt 363.0 lb

## 2017-12-02 DIAGNOSIS — G4733 Obstructive sleep apnea (adult) (pediatric): Secondary | ICD-10-CM | POA: Diagnosis not present

## 2017-12-02 DIAGNOSIS — I272 Pulmonary hypertension, unspecified: Secondary | ICD-10-CM

## 2017-12-02 DIAGNOSIS — I5032 Chronic diastolic (congestive) heart failure: Secondary | ICD-10-CM | POA: Diagnosis not present

## 2017-12-02 DIAGNOSIS — J9611 Chronic respiratory failure with hypoxia: Secondary | ICD-10-CM

## 2017-12-02 DIAGNOSIS — R942 Abnormal results of pulmonary function studies: Secondary | ICD-10-CM

## 2017-12-02 NOTE — Progress Notes (Signed)
SIX MIN WALK 12/02/2017 11/11/2017  Medications Diamox 500mg , coreg 3.125mg , Glucotrol XL 5mg , prinivil 5mg , mevacor 40mg , and kenalog 0.5% this morning -  Supplimental Oxygen during Test? (L/min) Yes No  O2 Flow Rate 2 -  Type Continuous -  Laps 4 -  Partial Lap (in Meters) 0 -  Baseline BP (sitting) 112/72 -  Baseline Heartrate 66 -  Baseline Dyspnea (Borg Scale) 0 -  Baseline Fatigue (Borg Scale) 4 -  Baseline SPO2 90 -  BP (sitting) 122/78 -  Heartrate 112 -  Dyspnea (Borg Scale) 5 -  Fatigue (Borg Scale) 6 -  SPO2 92 -  BP (sitting) 102/68 -  Heartrate 72 -  SPO2 96 -  Stopped or Paused before Six Minutes No -  Interpretation Hip pain -  Distance Completed 192 -  Tech Comments: Patient started walk on 2L con't, ended on 4L con't walked at steady pace - mild to moderate SOB - 3rd lap incomplete PO2 88% / Karlton Lemon RN

## 2017-12-02 NOTE — Progress Notes (Signed)
Synopsis: Referred in Sept 2019 for Pulmonary Hypertension in the setting of obstructive sleep apnea, chronic respiratory failure with hypoxemia, and a prior heavy smoking history.  She smoked 1 and 1/2 packs of cigarettes daily for 24 years, quit at age 52. Subjective:   PATIENT ID: Emily Mooney GENDER: female DOB: 03-10-1965, MRN: [id number]   HPI  Chief Complaint  Patient presents with  . Follow-up    today   Emily Mooney says that her breathing is about the same since the last visit.  She has not had problems with cough or mucus production.  She says that she has not been prescribed any inhalers.  Her breathing is essentially unchanged compared to the last visit.  She has plans for a CPAP titration study on October 31.  Her weight is remaining stable.  She continues to take her diuretics daily.  She continues to use and benefit from her oxygen continuously.   Past Medical History:  Diagnosis Date  . CHF (congestive heart failure) (HCC)   . Diabetes mellitus without complication (HCC)   . Numbness in feet       Review of Systems  Constitutional: Negative for chills, fever, malaise/fatigue and weight loss.  HENT: Negative for congestion, nosebleeds, sinus pain and sore throat.   Eyes: Negative for photophobia, pain and discharge.  Respiratory: Positive for shortness of breath. Negative for cough, hemoptysis, sputum production and wheezing.   Cardiovascular: Negative for chest pain, palpitations, orthopnea and leg swelling.  Gastrointestinal: Negative for abdominal pain, constipation, diarrhea, nausea and vomiting.  Genitourinary: Negative for dysuria, frequency, hematuria and urgency.  Musculoskeletal: Negative for back pain, joint pain, myalgias and neck pain.  Skin: Negative for itching and rash.  Neurological: Negative for tingling, tremors, sensory change, speech change, focal weakness, seizures, weakness and headaches.  Psychiatric/Behavioral: Negative for memory loss,  substance abuse and suicidal ideas. The patient is not nervous/anxious.       Objective:  Physical Exam   Vitals:   12/02/17 1113  BP: 138/80  Pulse: 100  SpO2: 94%  Weight: (!) 363 lb (164.7 kg)   2L [address]  Gen: obese, chronically ill appearing HENT: OP clear, TM's clear, neck supple PULM: CTA B, normal percussion CV: RRR, no mgr, trace edema GI: BS+, soft, nontender Derm: no cyanosis or rash Psyche: normal mood and affect     CBC    Component Value Date/Time   WBC 8.4 08/30/2017 1937   RBC 4.17 08/30/2017 1937   HGB 11.8 (L) 08/30/2017 1937   HCT 37.3 08/30/2017 1937   PLT 237 08/30/2017 1937   MCV 89.4 08/30/2017 1937   MCH 28.3 08/30/2017 1937   MCHC 31.6 08/30/2017 1937   RDW 18.8 (H) 08/30/2017 1937   LYMPHSABS 1.8 08/30/2017 1937   MONOABS 0.5 08/30/2017 1937   EOSABS 0.4 08/30/2017 1937   BASOSABS 0.0 08/30/2017 1937     Chest imaging: June 2018 CT chest images independently reviewed showing essentially normal pulmonary parenchyma, I question whether or not there is centrilobular groundglass versus air trapping, not a high-resolution CT. August 2018 CT angiogram chest showed no pulmonary embolism  PFT: September 2019 ratio 86%, FVC 2.80 L 67% predicted, total lung capacity 4.6 L 78% predicted, DLCO 18.4 mL 59% predicted  Labs:  Path:  walk: October 2019 6-minute walk test: 192 m, O2 saturation nadir 92% on 4 L nasal cannula I just saw her for the first time she was sent  Echo: June 2019 transthoracic echocardiogram  says technically difficult study, LVEF normal 60 to 65%, pseudo-normal LV filling pattern, mildly enlarged right ventricle with normal systolic function, left atrium dilated, moderate concentric LVH, moderate tricuspid regurgitation, RVSP is 59 mmHg echo shows evidence of increased right atrial pressure  Heart Catheterization:  Sleep study: 09/2017: attempted, didn't sleep well, but O2 saturation dropped < 88% for more than 15  minutes     Assessment & Plan:   Abnormal PFT - Plan: CT Chest High Resolution  Pulmonary HTN (HCC)  Obstructive sleep apnea  Chronic diastolic congestive heart failure (HCC)  Chronic respiratory failure with hypoxia (HCC)  Discussion: Emily Mooney returns to clinic today for further evaluation of pulmonary hypertension in the setting of morbid obesity, obstructive sleep apnea, chronic diastolic heart failure and a long history of cigarette smoking.  Lung function testing from September 2019 did not show evidence of COPD but she does have a significantly decreased DLCO.  At this point I have very little evidence that there is an underlying pulmonary parenchymal disease, though I question whether or not there was some degree of groundglass seen in the upper lobes from prior studies.  This raises concern for something like hypersensitivity pneumonitis. While I suspect that her chronic respiratory failure with hypoxemia is due to to her diastolic heart failure, the severity of it makes me want to ensure that there is no evidence of pulmonary parenchymal disease.  Plan: Chronic respiratory failure with hypoxemia: Keep using 2 L of oxygen when you are resting, however when you walk you need to be using 4 L of oxygen  Shortness of breath: Today's lung function test did not show evidence of COPD However, I would like to get a high-resolution CT scan of your chest to make sure there is not evidence of another lung tissue problem  Chronic respiratory failure with hypoxemia: Keep taking the diuretic medicines as directed by the cardiology clinic  Obstructive sleep apnea: Keep the appointment for the CPAP titration study on October 31  We will see you back in 1 month or sooner if needed  > 50% of this 25 minute visit spent face to face    Current Outpatient Medications:  .  acetaZOLAMIDE (DIAMOX) 500 MG capsule, Take 1 capsule (500 mg total) by mouth 2 (two) times daily., Disp: 60  capsule, Rfl: 12 .  carvedilol (COREG) 3.125 MG tablet, Take 3.125 mg by mouth 2 (two) times daily., Disp: , Rfl: 3 .  CINNAMON PO, Take 1,000 mg by mouth daily., Disp: , Rfl:  .  furosemide (LASIX) 40 MG tablet, Take 40 mg by mouth daily. May increase to 60mg  if weight up 3 pounds, Disp: , Rfl:  .  glipiZIDE (GLUCOTROL XL) 5 MG 24 hr tablet, Take 5 mg by mouth daily with breakfast., Disp: , Rfl:  .  lisinopril (PRINIVIL,ZESTRIL) 5 MG tablet, Take 5 mg by mouth daily., Disp: , Rfl: 6 .  lovastatin (MEVACOR) 40 MG tablet, Take 40 mg by mouth 2 (two) times daily. , Disp: , Rfl:

## 2017-12-02 NOTE — Patient Instructions (Signed)
Chronic respiratory failure with hypoxemia: Keep using 2 L of oxygen when you are resting, however when you walk you need to be using 4 L of oxygen  Shortness of breath: Today's lung function test did not show evidence of COPD However, I would like to get a high-resolution CT scan of your chest to make sure there is not evidence of another lung tissue problem  Chronic respiratory failure with hypoxemia: Keep taking the diuretic medicines as directed by the cardiology clinic  Obstructive sleep apnea: Keep the appointment for the CPAP titration study on October 31  We will see you back in 1 month or sooner if needed

## 2017-12-05 ENCOUNTER — Ambulatory Visit: Payer: Medicaid Other | Admitting: Diagnostic Neuroimaging

## 2017-12-05 ENCOUNTER — Encounter: Payer: Self-pay | Admitting: Diagnostic Neuroimaging

## 2017-12-05 VITALS — BP 117/75 | HR 67 | Ht 70.0 in | Wt 373.0 lb

## 2017-12-05 DIAGNOSIS — H4711 Papilledema associated with increased intracranial pressure: Secondary | ICD-10-CM | POA: Diagnosis not present

## 2017-12-05 DIAGNOSIS — H543 Unqualified visual loss, both eyes: Secondary | ICD-10-CM

## 2017-12-05 DIAGNOSIS — G932 Benign intracranial hypertension: Secondary | ICD-10-CM

## 2017-12-05 MED ORDER — ACETAZOLAMIDE ER 500 MG PO CP12: 500.0000 mg | ORAL_CAPSULE | Freq: Two times a day (BID) | ORAL | 12 refills | Status: DC

## 2017-12-05 NOTE — Progress Notes (Signed)
[facility]  PATIENT: Emily Mooney DOB: [birth year]  REFERRING CLINICIAN: ER  HISTORY FROM: patient and mother  REASON FOR VISIT: follow up   HISTORICAL  CHIEF COMPLAINT:  Chief Complaint  Patient presents with  . Papilledema    rm 7, mom- Pat, "no change in right eye- still blurry"  . Follow-up    3 month    HISTORY OF PRESENT ILLNESS:    UPDATE (12/05/17, VRP): Since last visit, doing about the same.  Continues with 3 headaches per week.  Vision is stable.  Tolerating acetazolamide.  Since last visit she lost her previous insurance and now is on Medicaid.  Therefore she was not able to see ophthalmology or neurosurgery.  She continues to deal with congestive heart failure and pulmonary hypertension.  She is on chronic oxygen.  She has a difficult time seeing.  She is not able to drive a car.  She is planning to apply for disability.  She was previously a CMA.  NEW HPI (09/02/17, VRP): 52 year old female with history of heart failure, obesity, pseudotumor cerebri, here for evaluation.  I reviewed prior neurology notes which are referred to below.  Patient was initially diagnosed with pseudotumor cerebri in 2004, tried on Topamax and Diamox in the past.  She was referred for bariatric surgery as well as VP shunt placement in the past but could not afford these due to financial limitations.  In the past 1 year patient was not able to afford her medications and therefore stopped taking her medications.  Patient symptoms have worsened.  3 months ago she started to have increasing blurred vision, transient visual obscurations, ultimately going to the emergency room on 08/30/2017.  Lumbar puncture was obtained which showed opening pressure of 54 cm of water.  Closing pressure was 20 cm water.  Patient was prescribed acetazolamide but she did not start this as she was not able to afford her medication.  Patient having some mild headaches and tinnitus.   PRIOR RECORDS PER  AMY KEARNS, PA-c:  "Emily Mooney is a 51 y.o. female seen at [facility] for a follow up visit for the following: Pseudotumor cerebri. The onset of the blurring of vision has been sudden and has been occurring in an intermittent (mostly notices blurred vision when writing) pattern for months (started in January). The course has been recurrent. The blurring of vision is characterized as painless and a blurring of vision. The symptoms have been associated with diabetes mellitus, while the symptoms have not been associated with diplopia or headache. Note for "Blurring of vision": Emily Mooney was referred to our office by her optometrist due to recent exam findings of bilateral papilledema. Her PMH is significant for pseudotumor cerebri. She was previously diagnosed with this around 2004 (treated by Dr. Orie Rout). She denies ever having a scan of her brain and no records are available. Past treatment for the pseudotumor cerebri includes a large volume spinal tap, Diamox, and Topamax. She is unsure how long ago she stopped taking the medications. Denies any recent weight gain. She actually reports a weight loss of 56 lbs since January of this year.  Update 06/06/13 (above notes are from initial evaluation). Emily Mooney is here today for follow up after having a MRI of her brain. The results of the MRI were reviewed with her (under Diagnostic Studies). She reports some improvement in her blurred vision. She is now getting a headache every time she coughs.  Update 07/25/13. She is here today  for follow up after having a LP on 07/05/13. Her opening pressure of 37 cm was diagnostic of pseudotumor cerebri. She was started on Diamox 250 mg, 2 pills tid after the LP. She denies having any side effects from the medication but has noticed an increased frequency in urinating. She reports having 5-6 headaches per week. The headaches last only a few seconds and are described as sharp. She does not take anything  for acute treatment of the headaches since they only last a few seconds. She has not returned to the optometrist for a recheck.  Update 08/29/13. She had a dilated eye exam on 08/02/13. Per Dr. Guillermina [place] note she still had subtle papilledema (OD > OS). She has been taking the Diamox as directed. She is still having some blurred vision and brief headaches daily. She has lost 8 lbs since her last appointment.   Update 10/10/13. Topamax was added at the last appointment. She is now taking 75 mg with no side effects reported. She reports that her headaches have stopped. She is also no longer having any blurred vision. Her weight is down 4 lbs since her last appointment.   Update 12/12/13. After her last appointment she was evaluated by optometry again who found that she still had some mild papilledema. The dose of Topamax was then increased to 150 mg. The only side effect reported is intermittent paresthesias in her hands and feet. She also has experienced some dizziness but is not sure if it is related to the Topamax. She still has intermittent blurred vision and brief sharp headaches. She has not lost any more weight.   Update 02/13/14. At the last appointment the dose of Topamax was increased to 200 mg and she was also referred to bariatric surgery. Unfortunately her insurance will not cover bariatric surgery. She denies having any side effects from the increased dose of Topamax. She reports having blurred vision but only when reading. She reports that the frequency of her brief headaches has decreased. She was evaluated again by optometry on 12/21/13. She was found to still have bilateral papilledema. Her weight is down 10 lbs since her last appointment.   Update 04/10/14. The dose of Topamax was increased to 300 mg daily at the last appointment. She had a repeat LP on 03/08/14. Her opening pressure was 29 which was down from 37 in 06/2013. The dose of Topamax was then increased to 400 mg daily. On 03/21/14 she  had formal VF testing which showed evidence of apparent inferior binasal hemianopia. Her weight today in the office remains at 330 lbs.   Update 06/20/14. At the last appointment she was referred to Neurosurgery for evaluation regarding spinal fluid shunt. Unfortunately her current health insurance does not cover surgical procedures. Today her weight is at 334 lbs. She denies having any headaches or visual changes.  Update 10/30/14. Today she complains of having blurred vision with frequent "flash headaches" that last only a few minutes. She has not had an eye exam since February of this year. She did not follow up with Neurosurgery because her insurance at that time would not pay for surgery. She recently got a new job and now has Medicaid. She continues to take Topamax and Diamox as prescribed.   Update 03/24/16. It has been over a year since patient was last seen in the office. Patient had an eye exam yesterday. Notes from [facility] care reviewed. Exam revealed papilledema in both eyes. She reports stopping both Topamax and Diamox about a  year ago due to costs. Her health insurance does not cover medications or office visits. Patient has also gained approximately 70 lbs since her last visit. She does not cook much and eats take out most of the time."   REVIEW OF SYSTEMS: Full 14 system review of systems performed and negative with exception of: Blurred vision fatigue.   ALLERGIES: Allergies  Allergen Reactions  . Depo-Medrol [Methylprednisolone] Anaphylaxis    HOME MEDICATIONS: Outpatient Medications Prior to Visit  Medication Sig Dispense Refill  . acetaZOLAMIDE (DIAMOX) 500 MG capsule Take 1 capsule (500 mg total) by mouth 2 (two) times daily. 60 capsule 12  . carvedilol (COREG) 3.125 MG tablet Take 3.125 mg by mouth 2 (two) times daily.  3  . CINNAMON PO Take 1,000 mg by mouth daily.    . furosemide (LASIX) 40 MG tablet Take 40 mg by mouth daily. May increase to 60mg  if weight up 3 pounds      . glipiZIDE (GLUCOTROL XL) 5 MG 24 hr tablet Take 5 mg by mouth daily with breakfast.    . lisinopril (PRINIVIL,ZESTRIL) 5 MG tablet Take 5 mg by mouth daily.  6  . lovastatin (MEVACOR) 40 MG tablet Take 40 mg by mouth 2 (two) times daily.      No facility-administered medications prior to visit.     PAST MEDICAL HISTORY: Past Medical History:  Diagnosis Date  . CHF (congestive heart failure) (HCC)   . Diabetes mellitus without complication (HCC)   . Numbness in feet     PAST SURGICAL HISTORY: Past Surgical History:  Procedure Laterality Date  . TOOTH EXTRACTION    . TUBAL LIGATION      FAMILY HISTORY: Family History  Problem Relation Age of Onset  . Cancer Father     SOCIAL HISTORY:  Social History   Socioeconomic History  . Marital status: Single    Spouse name: Not on file  . Number of children: Not on file  . Years of education: Not on file  . Highest education level: Not on file  Occupational History  . Not on file  Social Needs  . Financial resource strain: Not on file  . Food insecurity:    Worry: Not on file    Inability: Not on file  . Transportation needs:    Medical: Not on file    Non-medical: Not on file  Tobacco Use  . Smoking status: Former Games developer  . Smokeless tobacco: Never Used  Substance and Sexual Activity  . Alcohol use: Not Currently  . Drug use: Never  . Sexual activity: Not on file  Lifestyle  . Physical activity:    Days per week: Not on file    Minutes per session: Not on file  . Stress: Not on file  Relationships  . Social connections:    Talks on phone: Not on file    Gets together: Not on file    Attends religious service: Not on file    Active member of club or organization: Not on file    Attends meetings of clubs or organizations: Not on file    Relationship status: Not on file  . Intimate partner violence:    Fear of current or ex partner: Not on file    Emotionally abused: Not on file    Physically abused: Not  on file    Forced sexual activity: Not on file  Other Topics Concern  . Not on file  Social History Narrative  . Not on  file     PHYSICAL EXAM  GENERAL EXAM/CONSTITUTIONAL: Vitals:  Vitals:   12/05/17 0936  BP: 117/75  Pulse: 67  Weight: (!) 373 lb (169.2 kg)  Height: 5\' 10"  (1.778 m)   Wt Readings from Last 3 Encounters:  12/05/17 (!) 373 lb (169.2 kg)  12/02/17 (!) 363 lb (164.7 kg)  11/11/17 (!) 363 lb (164.7 kg)   Body mass index is 53.52 kg/m.  Visual Acuity Screening   Right eye Left eye Both eyes  Without correction:  20/50   With correction:     Comments: Unable to see letters with right eye   Patient is in no distress; well developed, nourished and groomed; neck is supple  ON O2 VIA [place]  CARDIOVASCULAR:  Examination of carotid arteries is normal; no carotid bruits  Regular rate and rhythm, no murmurs  Examination of peripheral vascular system by observation and palpation is normal  EYES:  Ophthalmoscopic exam of optic discs and posterior segments is NOTABLE FOR BLURRED OPTIC DISC MARGINS  MUSCULOSKELETAL:  Gait, strength, tone, movements noted in Neurologic exam below  NEUROLOGIC: MENTAL STATUS:  No flowsheet data found.  awake, alert, oriented to person, place and time  recent and remote memory intact  normal attention and concentration  language fluent, comprehension intact, naming intact,   fund of knowledge appropriate  CRANIAL NERVE:   2nd - BILATERAL PAPILLEDEMA  2nd, 3rd, 4th, 6th - pupils equal and SLUGGISH to light, visual fields full to confrontation, extraocular muscles intact, no nystagmus; CAN SEE LIGHT AND MOTION IN RIGHT EYE; LEFT EYE CAN COUNT FINGERS; DECR SENS TO LIGHT IN RIGHT EYE  5th - facial sensation symmetric  7th - facial strength symmetric  8th - hearing intact  9th - palate elevates symmetrically, uvula midline  11th - shoulder shrug symmetric  12th - tongue protrusion midline  MOTOR:   normal  bulk and tone, full strength in the BUE, BLE  SENSORY:   normal and symmetric to light touch, temperature, vibration  COORDINATION:   finger-nose-finger, fine finger movements normal  REFLEXES:   deep tendon reflexes TRACE and symmetric; ABSENT AT ANKLES  GAIT/STATION:   narrow based gait; able to walk on toes, heels and tandem; romberg is negative    DIAGNOSTIC DATA (LABS, IMAGING, TESTING) - I reviewed patient records, labs, notes, testing and imaging myself where available.  Lab Results  Component Value Date   WBC 8.4 08/30/2017   HGB 11.8 (L) 08/30/2017   HCT 37.3 08/30/2017   MCV 89.4 08/30/2017   PLT 237 08/30/2017      Component Value Date/Time   NA 138 10/04/2017 1103   K 4.3 10/04/2017 1103   CL 101 10/04/2017 1103   CO2 21 10/04/2017 1103   GLUCOSE 141 (H) 10/04/2017 1103   GLUCOSE 163 (H) 08/30/2017 1937   BUN 28 (H) 10/04/2017 1103   CREATININE 1.28 (H) 10/04/2017 1103   CALCIUM 9.5 10/04/2017 1103   GFRNONAA 49 (L) 10/04/2017 1103   GFRAA 56 (L) 10/04/2017 1103   No results found for: CHOL, HDL, LDLCALC, LDLDIRECT, TRIG, CHOLHDL No results found for: GNFA2Z No results found for: VITAMINB12 No results found for: TSH   08/30/17 CT head [I reviewed images myself and agree with interpretation. -VRP]  - Stigmata of idiopathic intracranial hypertension with slight kinking of the optic nerves bilaterally, flattening of the posterior aspect of the globes and anterior partially empty appearing pituitary sella.     ASSESSMENT AND PLAN  52 y.o. year  old female here with history of pseudotumor cerebri since 2004, with worsening symptoms in the last 3 months due to financial limitations and not taking her medications.  Patient having transient visual obscurations.  Dx:  1. IIH (idiopathic intracranial hypertension)   2. Vision loss, bilateral   3. Papilledema associated with increased intracranial pressure   4. Morbid obesity (HCC)       PLAN:  idiopathic intracranial hypertension (pseudotumor cerebri) / RIGHT EYE VISION LOSS - continue acetazolamide ER 500mg  twice a day  - follow up neurosurgery (Dr. Petra Kuba, [facility]) for consideration of shunt due to severe symptoms and to avoid permanent vision loss - follow up with ophthalmology [facility]) - follow up with PCP, cardiology, pulmonary  OBESITY - I recommend bariatric surgery evaluation; patient will discuss with PCP and cardiology  Meds ordered this encounter  Medications  . acetaZOLAMIDE (DIAMOX) 500 MG capsule    Sig: Take 1 capsule (500 mg total) by mouth 2 (two) times daily.    Dispense:  60 capsule    Refill:  12   Return in about 6 months (around 06/06/2018).  I reviewed images, labs, notes, records myself. I summarized findings and reviewed with patient, for this high risk condition (vision loss) requiring high complexity decision making.     Suanne Marker, MD 12/05/2017, 9:45 AM Certified in Neurology, Neurophysiology and Neuroimaging  [facility] [address] [place], [place] [address] [phone]

## 2017-12-05 NOTE — Progress Notes (Signed)
Acetazolamide refill RX faxed to [facility], [place].

## 2017-12-07 DIAGNOSIS — Z0289 Encounter for other administrative examinations: Secondary | ICD-10-CM

## 2017-12-12 ENCOUNTER — Encounter: Payer: Self-pay | Admitting: Cardiology

## 2017-12-12 ENCOUNTER — Ambulatory Visit (INDEPENDENT_AMBULATORY_CARE_PROVIDER_SITE_OTHER): Payer: Medicaid Other | Admitting: Cardiology

## 2017-12-12 VITALS — BP 110/64 | HR 84 | Ht 69.0 in | Wt 370.0 lb

## 2017-12-12 DIAGNOSIS — G4733 Obstructive sleep apnea (adult) (pediatric): Secondary | ICD-10-CM

## 2017-12-12 DIAGNOSIS — I272 Pulmonary hypertension, unspecified: Secondary | ICD-10-CM | POA: Diagnosis not present

## 2017-12-12 DIAGNOSIS — E119 Type 2 diabetes mellitus without complications: Secondary | ICD-10-CM

## 2017-12-12 DIAGNOSIS — I10 Essential (primary) hypertension: Secondary | ICD-10-CM | POA: Diagnosis not present

## 2017-12-12 DIAGNOSIS — I5032 Chronic diastolic (congestive) heart failure: Secondary | ICD-10-CM | POA: Diagnosis not present

## 2017-12-12 MED ORDER — ROSUVASTATIN CALCIUM 20 MG PO TABS: 20.0000 mg | ORAL_TABLET | Freq: Every day | ORAL | 1 refills | Status: DC

## 2017-12-12 NOTE — Patient Instructions (Signed)
Medication Instructions:  Your physician has recommended you make the following change in your medication:  STOP: Mevacor  START: Crestor 20 mg daily  If you need a refill on your cardiac medications before your next appointment, please call your pharmacy.   Lab work: Your physician recommends that you return for lab work in 6 weeks: LFT and Lipids in 6 weeks (December 9th 2019) If you have labs (blood work) drawn today and your tests are completely normal, you will receive your results only by: Marland Kitchen MyChart Message (if you have MyChart) OR . A paper copy in the mail If you have any lab test that is abnormal or we need to change your treatment, we will call you to review the results.  Testing/Procedures: None.   Follow-Up: At [facility], you and your health needs are our priority.  As part of our continuing mission to provide you with exceptional heart care, we have created designated Provider Care Teams.  These Care Teams include your primary Cardiologist (physician) and Advanced Practice Providers (APPs -  Physician Assistants and Nurse Practitioners) who all work together to provide you with the care you need, when you need it. You will need a follow up appointment in 3 months.  Please call our office 2 months in advance to schedule this appointment.  You may see Gypsy Balsam, MD or another member of our [facility] Provider Team in [place]: Norman Herrlich, MD . Belva Crome, MD  Any Other Special Instructions Will Be Listed Below (If Applicable).  Rosuvastatin Tablets What is this medicine? ROSUVASTATIN (roe SOO va sta tin) is known as a HMG-CoA reductase inhibitor or 'statin'. It lowers cholesterol and triglycerides in the blood. This drug may also reduce the risk of heart attack, stroke, or other health problems in patients with risk factors for heart disease. Diet and lifestyle changes are often used with this drug. This medicine may be used for other purposes; ask your  health care provider or pharmacist if you have questions. COMMON BRAND NAME(S): Crestor What should I tell my health care provider before I take this medicine? They need to know if you have any of these conditions: -frequently drink alcoholic beverages -kidney disease -liver disease -muscle aches or weakness -other medical condition -an unusual or allergic reaction to rosuvastatin, other medicines, foods, dyes, or preservatives -pregnant or trying to get pregnant -breast-feeding How should I use this medicine? Take this medicine by mouth with a glass of water. Follow the directions on the prescription label. Do not cut, crush or chew this medicine. You can take this medicine with or without food. Take your doses at regular intervals. Do not take your medicine more often than directed. Talk to your pediatrician regarding the use of this medicine in children. While this drug may be prescribed for children as young as 20 years old for selected conditions, precautions do apply. Overdosage: If you think you have taken too much of this medicine contact a poison control center or emergency room at once. NOTE: This medicine is only for you. Do not share this medicine with others. What if I miss a dose? If you miss a dose, take it as soon as you can. Do not take 2 doses within 12 hours of each other. If there are less than 12 hours until your next dose, take only that dose. Do not take double or extra doses. What may interact with this medicine? Do not take this medicine with any of the following medications: -herbal medicines  like red yeast rice This medicine may also interact with the following medications: -alcohol -antacids containing aluminum hydroxide or magnesium hydroxide -cyclosporine -other medicines for high cholesterol -some medicines for HIV infection -warfarin This list may not describe all possible interactions. Give your health care provider a list of all the medicines, herbs,  non-prescription drugs, or dietary supplements you use. Also tell them if you smoke, drink alcohol, or use illegal drugs. Some items may interact with your medicine. What should I watch for while using this medicine? Visit your doctor or health care professional for regular check-ups. You may need regular tests to make sure your liver is working properly. Tell your doctor or health care professional right away if you get any unexplained muscle pain, tenderness, or weakness, especially if you also have a fever and tiredness. Your doctor or health care professional may tell you to stop taking this medicine if you develop muscle problems. If your muscle problems do not go away after stopping this medicine, contact your health care professional. This medicine may affect blood sugar levels. If you have diabetes, check with your doctor or health care professional before you change your diet or the dose of your diabetic medicine. Avoid taking antacids containing aluminum, calcium or magnesium within 2 hours of taking this medicine. This drug is only part of a total heart-health program. Your doctor or a dietician can suggest a low-cholesterol and low-fat diet to help. Avoid alcohol and smoking, and keep a proper exercise schedule. Do not use this drug if you are pregnant or breast-feeding. Serious side effects to an unborn child or to an infant are possible. Talk to your doctor or pharmacist for more information. What side effects may I notice from receiving this medicine? Side effects that you should report to your doctor or health care professional as soon as possible: -allergic reactions like skin rash, itching or hives, swelling of the face, lips, or tongue -dark urine -fever -joint pain -muscle cramps, pain -redness, blistering, peeling or loosening of the skin, including inside the mouth -trouble passing urine or change in the amount of urine -unusually weak or tired -yellowing of the eyes or  skin Side effects that usually do not require medical attention (report to your doctor or health care professional if they continue or are bothersome): -constipation -heartburn -nausea -stomach gas, pain, upset This list may not describe all possible side effects. Call your doctor for medical advice about side effects. You may report side effects to FDA at 1-800-FDA-1088. Where should I keep my medicine? Keep out of the reach of children. Store at room temperature between 20 and 25 degrees C (68 and 77 degrees F). Keep container tightly closed (protect from moisture). Throw away any unused medicine after the expiration date. NOTE: This sheet is a summary. It may not cover all possible information. If you have questions about this medicine, talk to your doctor, pharmacist, or health care provider.  2018 Elsevier/Gold Standard (2014-07-18 13:33:08)

## 2017-12-12 NOTE — Progress Notes (Signed)
Cardiology Office Note:    Date:  12/12/2017   ID:  Emily Mooney, DOB 05-08-1965, MRN [id number]  PCP:  Dulce Sellar, NP  Cardiologist:  Gypsy Balsam, MD    Referring MD: Wilmer Floor., MD   Chief Complaint  Patient presents with  . 2 month follow up  Doing fair  History of Present Illness:    Emily Mooney is a 52 y.o. female with diastolic congestive heart failure.  Comes today to my office for follow-up also does have a history of pulmonary hypertension thinking is that her pulmonary hypertension is related to hypoxia as well as to sleep apnea and in the matter-of-fact next few days she is going to have another sleep study to confirm the diagnosis sleep apnea and start appropriate treatment.  The situation is also complicated by increased intracranial pressure.  She was advised to have shunt however she does not want to get it.  Described to have exertional shortness of breath she is oxygen all the time swelling of lower extremities seems to be improving.  Past Medical History:  Diagnosis Date  . CHF (congestive heart failure) (HCC)   . Diabetes mellitus without complication (HCC)   . Numbness in feet     Past Surgical History:  Procedure Laterality Date  . TOOTH EXTRACTION    . TUBAL LIGATION      Current Medications: Current Meds  Medication Sig  . acetaZOLAMIDE (DIAMOX) 500 MG capsule Take 1 capsule (500 mg total) by mouth 2 (two) times daily.  . carvedilol (COREG) 3.125 MG tablet Take 3.125 mg by mouth 2 (two) times daily.  Marland Kitchen CINNAMON PO Take 1,000 mg by mouth daily.  . furosemide (LASIX) 40 MG tablet Take 40 mg by mouth daily. May increase to 60mg  if weight up 3 pounds  . glipiZIDE (GLUCOTROL XL) 5 MG 24 hr tablet Take 5 mg by mouth daily with breakfast.  . lisinopril (PRINIVIL,ZESTRIL) 5 MG tablet Take 5 mg by mouth daily.  Marland Kitchen lovastatin (MEVACOR) 40 MG tablet Take 40 mg by mouth. 1 in the morning and 2 at night     Allergies:   Depo-medrol  [methylprednisolone]   Social History   Socioeconomic History  . Marital status: Single    Spouse name: Not on file  . Number of children: Not on file  . Years of education: Not on file  . Highest education level: Not on file  Occupational History  . Not on file  Social Needs  . Financial resource strain: Not on file  . Food insecurity:    Worry: Not on file    Inability: Not on file  . Transportation needs:    Medical: Not on file    Non-medical: Not on file  Tobacco Use  . Smoking status: Former Games developer  . Smokeless tobacco: Never Used  Substance and Sexual Activity  . Alcohol use: Not Currently  . Drug use: Never  . Sexual activity: Not on file  Lifestyle  . Physical activity:    Days per week: Not on file    Minutes per session: Not on file  . Stress: Not on file  Relationships  . Social connections:    Talks on phone: Not on file    Gets together: Not on file    Attends religious service: Not on file    Active member of club or organization: Not on file    Attends meetings of clubs or organizations: Not on file    Relationship status: Not  on file  Other Topics Concern  . Not on file  Social History Narrative  . Not on file     Family History: The patient's family history includes Cancer in her father. ROS:   Please see the history of present illness.    All 14 point review of systems negative except as described per history of present illness  EKGs/Labs/Other Studies Reviewed:      Recent Labs: 08/30/2017: Hemoglobin 11.8; Platelets 237 10/04/2017: BUN 28; Creatinine, Ser 1.28; NT-Pro BNP 51; Potassium 4.3; Sodium 138  Recent Lipid Panel No results found for: CHOL, TRIG, HDL, CHOLHDL, VLDL, LDLCALC, LDLDIRECT  Physical Exam:    VS:  BP 110/64   Pulse 84   Ht 5\' 9"  (1.753 m)   Wt (!) 370 lb (167.8 kg)   SpO2 95%   BMI 54.64 kg/m     Wt Readings from Last 3 Encounters:  12/12/17 (!) 370 lb (167.8 kg)  12/05/17 (!) 373 lb (169.2 kg)  12/02/17  (!) 363 lb (164.7 kg)     GEN:  Well nourished, well developed in no acute distress HEENT: Normal NECK: No JVD; No carotid bruits LYMPHATICS: No lymphadenopathy CARDIAC: RRR, no murmurs, no rubs, no gallops RESPIRATORY:  Clear to auscultation without rales, wheezing or rhonchi  ABDOMEN: Soft, non-tender, non-distended MUSCULOSKELETAL:  No edema; No deformity  SKIN: Warm and dry LOWER EXTREMITIES: no swelling NEUROLOGIC:  Alert and oriented x 3 PSYCHIATRIC:  Normal affect   ASSESSMENT:    1. Chronic diastolic congestive heart failure (HCC)   2. Essential hypertension   3. Pulmonary HTN (HCC)   4. Obstructive sleep apnea   5. Morbid obesity (HCC)    PLAN:    In order of problems listed above:  1. Chronic diastolic congestive heart failure appears to be compensated continue present management. 2. Essential hypertension blood pressure under control continue present management. 3. Pulmonary hypertension awaiting for appropriate management of sleep apnea so pulmonary pressure can be rechecked.  I anticipate it to go down. 4. Morbid obesity obviously a problem some but she mentioned to her about potentially doing a gastric bypass surgery she is contemplating. 5. Dyslipidemia her LDL is still elevated I will stop her Mevacor and put on Crestor 20.   Medication Adjustments/Labs and Tests Ordered: Current medicines are reviewed at length with the patient today.  Concerns regarding medicines are outlined above.  No orders of the defined types were placed in this encounter.  Medication changes: No orders of the defined types were placed in this encounter.   Signed, Georgeanna Lea, MD, [facility] 12/12/2017 10:49 AM    [facility]

## 2017-12-15 ENCOUNTER — Ambulatory Visit (HOSPITAL_BASED_OUTPATIENT_CLINIC_OR_DEPARTMENT_OTHER): Payer: Medicaid Other | Attending: Cardiovascular Disease | Admitting: Cardiovascular Disease

## 2017-12-15 DIAGNOSIS — G4761 Periodic limb movement disorder: Secondary | ICD-10-CM | POA: Diagnosis not present

## 2017-12-15 DIAGNOSIS — Z7984 Long term (current) use of oral hypoglycemic drugs: Secondary | ICD-10-CM | POA: Insufficient documentation

## 2017-12-15 DIAGNOSIS — Z79899 Other long term (current) drug therapy: Secondary | ICD-10-CM | POA: Diagnosis not present

## 2017-12-15 DIAGNOSIS — G4733 Obstructive sleep apnea (adult) (pediatric): Secondary | ICD-10-CM

## 2017-12-16 ENCOUNTER — Telehealth: Payer: Self-pay

## 2017-12-16 NOTE — Telephone Encounter (Signed)
Called patient, unable to reach her. Left a message regarding giving [place] a call back.

## 2017-12-16 NOTE — Telephone Encounter (Signed)
-----   Message from [name], MD sent at 12/16/2017  2:36 PM EDT ----- Emily Mooney,  Please let her know that the CT of her chest didn't show a lung tissue problem which is good.  [name]

## 2017-12-20 NOTE — Telephone Encounter (Signed)
Attempted to call patient, no answer, left message to call back.  

## 2017-12-21 NOTE — Telephone Encounter (Signed)
Patient is returning call. CB is [phone]

## 2017-12-21 NOTE — Telephone Encounter (Signed)
Called and spoke with patient she is aware and verbalized understanding. Nothing further needed.  

## 2017-12-24 ENCOUNTER — Encounter (HOSPITAL_BASED_OUTPATIENT_CLINIC_OR_DEPARTMENT_OTHER): Payer: Self-pay | Admitting: Cardiovascular Disease

## 2017-12-24 NOTE — Procedures (Signed)
Patient Name: Emily Mooney, Emily Mooney Date: 12/15/2017 Gender: Female D.O.B: 04-07-1965 Age (years): 51 Referring Provider: Nicki Guadalajara MD, ABSM Height (inches): 70 Interpreting Physician: Nicki Guadalajara MD, ABSM Weight (lbs): 363 RPSGT: Lise Auer BMI: 52 MRN: [id number] Neck Size: 18.00  CLINICAL INFORMATION The patient is referred for a CPAP titration to treat sleep apnea.  Date of NPSG: 09/24/2017: AHI 20.3; RDI 22.2; supine AHI 25.5/h; absent REM sleep; O2 nadir 73%.  SLEEP STUDY TECHNIQUE As per the AASM Manual for the Scoring of Sleep and Associated Events v2.3 (April 2016) with a hypopnea requiring 4% desaturations.  The channels recorded and monitored were frontal, central and occipital EEG, electrooculogram (EOG), submentalis EMG (chin), nasal and oral airflow, thoracic and abdominal wall motion, anterior tibialis EMG, snore microphone, electrocardiogram, and pulse oximetry. Continuous positive airway pressure (CPAP) was initiated at the beginning of the study and titrated to treat sleep-disordered breathing.  MEDICATIONS     acetaZOLAMIDE (DIAMOX) 500 MG capsule         carvedilol (COREG) 3.125 MG tablet         CINNAMON PO         furosemide (LASIX) 40 MG tablet         glipiZIDE (GLUCOTROL XL) 5 MG 24 hr tablet         lisinopril (PRINIVIL,ZESTRIL) 5 MG tablet              Medications self-administered by patient taken the night of the study : N/A  TECHNICIAN COMMENTS Comments added by technician: Patient had difficulty initiating sleep. Comments added by scorer: N/A  RESPIRATORY PARAMETERS Optimal PAP Pressure (cm): 17 AHI at Optimal Pressure (/hr): 1.1 Overall Minimal O2 (%): 82.0 Supine % at Optimal Pressure (%): 100 Minimal O2 at Optimal Pressure (%): 86.0   SLEEP ARCHITECTURE The study was initiated at 11:00:39 PM and ended at 5:10:36 AM.  Sleep onset time was 72.4 minutes and the sleep efficiency was 56.8%%. The total sleep time was  210 minutes.  The patient spent 5.2%% of the night in stage N1 sleep, 76.2%% in stage N2 sleep, 3.8%% in stage N3 and 14.8% in REM.Stage REM latency was 93.5 minutes  Wake after sleep onset was 87.6. Alpha intrusion was absent. Supine sleep was 93.81%.  CARDIAC DATA The 2 lead EKG demonstrated sinus rhythm. The mean heart rate was 71.4 beats per minute. Other EKG findings include: None.  LEG MOVEMENT DATA The total Periodic Limb Movements of Sleep (PLMS) were 96. The PLMS index was 27.4. A PLMS index of <15 is considered normal in adults.  IMPRESSIONS - CPAP was initiated at 5 cm and was titrated to 17 cm water. The optimal PAP pressure was 17 cm of water.AHI at 17 cm was 1.1/h. - Central sleep apnea was not noted during this titration (CAI = 0.3/h). - Moderate oxygen desaturations were observed during this titration (min O2 = 82.0%). - The patient snored with moderate snoring volume during this titration study. - No cardiac abnormalities were observed during this study. - Clinically significant periodic limb movements were not noted during this study. Arousals associated with PLMs were rare.  DIAGNOSIS - Obstructive Sleep Apnea (327.23 [G47.33 ICD-10]) - Periodic Limb Movement of Sleep  RECOMMENDATIONS - Recommend an initial trial of CPAP therapy on 17 cm H2O with heated humidification. A Medium size Fisher&Paykel Full Face Mask Simplus mask was used for the titration. - Effort should be made to optimize nasal and oropharyngeal patency. - Avoid alcohol,  sedatives and other CNS depressants that may worsen sleep apnea and disrupt normal sleep architecture. - Sleep hygiene should be reviewed to assess factors that may improve sleep quality. - Weight management (BMI 52) and regular exercise should be initiated or continued. - Recommend a download be obtained in 30 days and sleep clinic evaluation after 4 weeks of therapy.   [Electronically signed] 12/24/2017 08:27 PM  Nicki Guadalajara MD,  [facility], ABSM Diplomate, American Board of Sleep Medicine   NPI: [id number] [facility] PH: [phone]   FX: [phone] ACCREDITED BY THE AMERICAN ACADEMY OF SLEEP MEDICINE

## 2017-12-27 ENCOUNTER — Telehealth: Payer: Self-pay | Admitting: *Deleted

## 2017-12-27 ENCOUNTER — Telehealth: Payer: Self-pay

## 2017-12-27 NOTE — Telephone Encounter (Signed)
Note in error.

## 2017-12-27 NOTE — Telephone Encounter (Signed)
Patient notified CPAP titration completed. Referral sent to [facility] for CPAP machine.

## 2017-12-27 NOTE — Telephone Encounter (Signed)
-----   Message from Lennette Bihari, MD sent at 12/24/2017  8:33 PM EST ----- Burna Mortimer, please notify pt and set up CPAP with DME and f/u sleep clinic.

## 2018-01-09 ENCOUNTER — Encounter: Payer: Self-pay | Admitting: Pulmonary Disease

## 2018-01-09 ENCOUNTER — Ambulatory Visit: Payer: Medicaid Other | Admitting: Pulmonary Disease

## 2018-01-09 VITALS — BP 140/82 | HR 74 | Ht 69.29 in | Wt 364.6 lb

## 2018-01-09 DIAGNOSIS — I272 Pulmonary hypertension, unspecified: Secondary | ICD-10-CM

## 2018-01-09 DIAGNOSIS — G4733 Obstructive sleep apnea (adult) (pediatric): Secondary | ICD-10-CM | POA: Diagnosis not present

## 2018-01-09 DIAGNOSIS — J9611 Chronic respiratory failure with hypoxia: Secondary | ICD-10-CM

## 2018-01-09 DIAGNOSIS — I5032 Chronic diastolic (congestive) heart failure: Secondary | ICD-10-CM | POA: Diagnosis not present

## 2018-01-09 NOTE — Progress Notes (Signed)
Synopsis: Referred in Sept 2019 for Pulmonary Hypertension in the setting of obstructive sleep apnea, chronic respiratory failure with hypoxemia, and a prior heavy smoking history.  She smoked 1 and 1/2 packs of cigarettes daily for 24 years, quit at age 52. Subjective:   PATIENT ID: Emily Mooney GENDER: female DOB: Jan 24, 1966, MRN: [id number]   HPI  Chief Complaint  Patient presents with  . Follow-up    CPAP titration on 12/15/17   Ms. Emily Mooney has been doing fairly well since the last visit.  Her weight is remaining down.  She continues to take her Lasix and Diamox.  She continues to use and benefit from her oxygen routinely.  She is trying to stay as active as possible.  She is supposed to have a VP shunt placed in January.  She had a CPAP titration study but has yet to receive a CPAP machine.  Past Medical History:  Diagnosis Date  . CHF (congestive heart failure) (HCC)   . Diabetes mellitus without complication (HCC)   . Numbness in feet       Review of Systems  Constitutional: Negative for chills, fever, malaise/fatigue and weight loss.  HENT: Negative for congestion, nosebleeds, sinus pain and sore throat.   Eyes: Negative for photophobia, pain and discharge.  Respiratory: Positive for shortness of breath. Negative for cough, hemoptysis, sputum production and wheezing.   Cardiovascular: Negative for chest pain, palpitations, orthopnea and leg swelling.  Gastrointestinal: Negative for abdominal pain, constipation, diarrhea, nausea and vomiting.  Genitourinary: Negative for dysuria, frequency, hematuria and urgency.  Musculoskeletal: Negative for back pain, joint pain, myalgias and neck pain.  Skin: Negative for itching and rash.  Neurological: Negative for tingling, tremors, sensory change, speech change, focal weakness, seizures, weakness and headaches.  Psychiatric/Behavioral: Negative for memory loss, substance abuse and suicidal ideas. The patient is not nervous/anxious.         Objective:  Physical Exam   Vitals:   01/09/18 0917  BP: 140/82  Pulse: 74  SpO2: 95%   2L   Gen: morbidly obese but well appearing HENT: OP clear, TM's clear, neck supple PULM: CTA B, normal percussion CV: RRR, no mgr, trace edema GI: BS+, soft, nontender Derm: no cyanosis or rash Psyche: normal mood and affect      CBC    Component Value Date/Time   WBC 8.4 08/30/2017 1937   RBC 4.17 08/30/2017 1937   HGB 11.8 (L) 08/30/2017 1937   HCT 37.3 08/30/2017 1937   PLT 237 08/30/2017 1937   MCV 89.4 08/30/2017 1937   MCH 28.3 08/30/2017 1937   MCHC 31.6 08/30/2017 1937   RDW 18.8 (H) 08/30/2017 1937   LYMPHSABS 1.8 08/30/2017 1937   MONOABS 0.5 08/30/2017 1937   EOSABS 0.4 08/30/2017 1937   BASOSABS 0.0 08/30/2017 1937     Chest imaging: June 2018 CT chest images independently reviewed showing essentially normal pulmonary parenchyma, I question whether or not there is centrilobular groundglass versus air trapping, not a high-resolution CT. August 2018 CT angiogram chest showed no pulmonary embolism 10/2017 HRCT images independently reviewed: Pulmonary arterial enlargement, subcarinal node stable at 1.6 cm, mosaicism noted which persist with expiratory images  PFT: September 2019 ratio 86%, FVC 2.80 L 67% predicted, total lung capacity 4.6 L 78% predicted, DLCO 18.4 mL 59% predicted  Labs:  Path:  walk: October 2019 6-minute walk test: 192 m, O2 saturation nadir 92% on 4 L nasal cannula I just saw her for the first time she  was sent  Echo: June 2019 transthoracic echocardiogram says technically difficult study, LVEF normal 60 to 65%, pseudo-normal LV filling pattern, mildly enlarged right ventricle with normal systolic function, left atrium dilated, moderate concentric LVH, moderate tricuspid regurgitation, RVSP is 59 mmHg echo shows evidence of increased right atrial pressure  Heart Catheterization:  Sleep study: 09/2017: attempted, didn't  sleep well, but O2 saturation dropped < 88% for more than 15 minutes October 2019 CPAP titration study: Optimal pressure 17 cm of water, O2 saturation at this pressure 86% on room air     Assessment & Plan:   No diagnosis found.  Discussion: Ms. Emily RisingMcNeil does not have a pulmonary parenchymal abnormality despite years of smoking but she does have evidence of pulmonary vascular pulmonary hypertension based on her abnormal echocardiogram and the CT scan which showed mosaicism in her chest.  It is unclear to me if she has [employer] Group 1 disease, though we know she clearly has underlying group 2 and group 3 disease with her diastolic heart failure and obstructive sleep apnea.  I think she would benefit from a right heart catheterization to make sure that her volume status is optimized and to assess for disproportionate pulmonary vascular hypertension.  She desperately needs to start CPAP right away.  Plan: Obstructive sleep apnea: I will reach out to your cardiology team to see if there is anything we can do to help speed up the process to get you CPAP You need to have 2 L of oxygen with CPAP at 17 cm of water  Diastolic heart failure: Continue taking lisinopril, furosemide, Coreg, and Diamox as directed by the cardiology clinic  Pulmonary hypertension: This is the medical term that means that the blood pressure in your lungs is higher than it should be I believe you should have a right heart catheterization within the next 3 to 6 months. I will reach out to the cardiology team to discuss this  Chronic respiratory failure with hypoxemia: Continue using oxygen continuously as you are doing  We will see you back in February 2020 or sooner if needed  Greater than 50% of this 25-minute visit spent face-to-face   Current Outpatient Medications:  .  acetaZOLAMIDE (DIAMOX) 500 MG capsule, Take 1 capsule (500 mg total) by mouth 2 (two) times daily., Disp: 60 capsule, Rfl: 12 .   carvedilol (COREG) 3.125 MG tablet, Take 3.125 mg by mouth 2 (two) times daily., Disp: , Rfl: 3 .  CINNAMON PO, Take 1,000 mg by mouth daily., Disp: , Rfl:  .  furosemide (LASIX) 40 MG tablet, Take 40 mg by mouth daily. May increase to 60mg  if weight up 3 pounds, Disp: , Rfl:  .  glipiZIDE (GLUCOTROL XL) 5 MG 24 hr tablet, Take 5 mg by mouth daily with breakfast., Disp: , Rfl:  .  lisinopril (PRINIVIL,ZESTRIL) 5 MG tablet, Take 5 mg by mouth daily., Disp: , Rfl: 6 .  rosuvastatin (CRESTOR) 20 MG tablet, Take 1 tablet (20 mg total) by mouth daily., Disp: 90 tablet, Rfl: 1 .  sitaGLIPtin-metformin (JANUMET) 50-1000 MG tablet, Take 1 tablet by mouth daily., Disp: , Rfl:

## 2018-01-09 NOTE — Patient Instructions (Signed)
Obstructive sleep apnea: I will reach out to your cardiology team to see if there is anything we can do to help speed up the process to get you CPAP You need to have 2 L of oxygen with CPAP at 17 cm of water  Diastolic heart failure: Continue taking lisinopril, furosemide, Coreg, and Diamox as directed by the cardiology clinic  Pulmonary hypertension: This is the medical term that means that the blood pressure in your lungs is higher than it should be I believe you should have a right heart catheterization within the next 3 to 6 months. I will reach out to the cardiology team to discuss this  Chronic respiratory failure with hypoxemia: Continue using oxygen continuously as you are doing  We will see you back in February 2020 or sooner if needed  Greater than 50% of this 25-minute visit spent face-to-face

## 2018-01-10 ENCOUNTER — Telehealth: Payer: Self-pay | Admitting: Pulmonary Disease

## 2018-01-10 NOTE — Telephone Encounter (Signed)
Wanda from Dr. Tresa EndoKelly office is calling and asking that BQ sign an order for the o2 to be bleed into the cpap machine. Dr. Tresa EndoKelly is out of the office until next week and they are wanting to get the order out asap so medicare will approve It. They are faxing request to triage fax machine.    Dr. Kendrick FriesMcQuaid are you okay to sign the order for o2 to be bleed in cpap?

## 2018-01-10 NOTE — Telephone Encounter (Signed)
OK to sign

## 2018-01-10 NOTE — Telephone Encounter (Signed)
Sending to Synetta Failnita to have her follow up on this CMN

## 2018-01-11 NOTE — Telephone Encounter (Signed)
No fax has been rec'd at this time in triage nor at front office area. Routing message to Synetta Failnita to advise

## 2018-01-11 NOTE — Telephone Encounter (Signed)
The previous message states Dr. Landry DykeKelly's office would be faxing the CMN to the triage fax. Did anyone get this CMN because I have not seen one on this patient

## 2018-01-11 NOTE — Telephone Encounter (Signed)
Sorry I didn't mean to close out this encounter. I was trying to put a message in it that I had called Burna MortimerWanda back at Dr. Devona Konigom Kelly's office asking for the CMN or who the DME provider was so I could call and get a copy of the CMN sent for Dr. Kendrick FriesMcQuaid to sign. She must be off today and I had to LVM for her to call me back.

## 2018-01-16 NOTE — Telephone Encounter (Signed)
Per Emily Mooney at Dr. Devona Konigom Kelly's office the Patient is already on 02 and [facility] is going to have the 02 bled into her CPAP. Nothing else needed

## 2018-03-08 DIAGNOSIS — Z0271 Encounter for disability determination: Secondary | ICD-10-CM

## 2018-03-20 ENCOUNTER — Ambulatory Visit: Payer: Medicaid Other | Admitting: Cardiology

## 2018-04-12 ENCOUNTER — Ambulatory Visit: Payer: Medicaid Other | Admitting: Pulmonary Disease

## 2018-04-12 ENCOUNTER — Encounter: Payer: Self-pay | Admitting: Pulmonary Disease

## 2018-04-12 VITALS — BP 118/64 | HR 83 | Ht 69.0 in | Wt 366.0 lb

## 2018-04-12 DIAGNOSIS — J9611 Chronic respiratory failure with hypoxia: Secondary | ICD-10-CM

## 2018-04-12 DIAGNOSIS — I5032 Chronic diastolic (congestive) heart failure: Secondary | ICD-10-CM | POA: Diagnosis not present

## 2018-04-12 DIAGNOSIS — I272 Pulmonary hypertension, unspecified: Secondary | ICD-10-CM | POA: Diagnosis not present

## 2018-04-12 DIAGNOSIS — G4733 Obstructive sleep apnea (adult) (pediatric): Secondary | ICD-10-CM | POA: Diagnosis not present

## 2018-04-12 NOTE — Progress Notes (Signed)
Synopsis: Referred in Sept 2019 for Pulmonary Hypertension in the setting of obstructive sleep apnea, chronic respiratory failure with hypoxemia, and a prior heavy smoking history.  She smoked 1 and 1/2 packs of cigarettes daily for 24 years, quit at age 53. Subjective:   PATIENT ID: Emily Mooney GENDER: female DOB: 1966/02/08, MRN: [id number]   HPI  Chief Complaint  Patient presents with  . Follow-up    OSA pt; hasn't recently been using CPAP due to shunt, using overnight O2 instead; breathing at baseline    She ahd her shunt revision last month and has been recovery fairly well since then.  She is feeling a little soreness at her right scalp.   Was using CPAP prior to surgery and she says that she has been doing quite well since then.  However, recently she has not been able to use CPAP because of her neurosurgery and having a little bit of pain in her scalp.  However she has been getting better.  Her's leg swelling is fairly well controlled right now.  No problems of shortness of breath.  She did not have any problems with her breathing after surgery.  Past Medical History:  Diagnosis Date  . CHF (congestive heart failure) (HCC)   . Diabetes mellitus without complication (HCC)   . Numbness in feet       Review of Systems  Constitutional: Negative for chills, fever, malaise/fatigue and weight loss.  HENT: Negative for congestion, nosebleeds, sinus pain and sore throat.   Eyes: Negative for photophobia, pain and discharge.  Respiratory: Positive for shortness of breath. Negative for cough, hemoptysis, sputum production and wheezing.   Cardiovascular: Negative for chest pain, palpitations, orthopnea and leg swelling.  Gastrointestinal: Negative for abdominal pain, constipation, diarrhea, nausea and vomiting.  Genitourinary: Negative for dysuria, frequency, hematuria and urgency.  Musculoskeletal: Negative for back pain, joint pain, myalgias and neck pain.  Skin: Negative for  itching and rash.  Neurological: Negative for tingling, tremors, sensory change, speech change, focal weakness, seizures, weakness and headaches.  Psychiatric/Behavioral: Negative for memory loss, substance abuse and suicidal ideas. The patient is not nervous/anxious.       Objective:  Physical Exam   Vitals:   04/12/18 0903  BP: 118/64  Pulse: 83  SpO2: 93%  Weight: (!) 366 lb (166 kg)  Height: 5\' 9"  (1.753 m)   2L [place]  Gen: morbidly obese, chronically ill appearing HENT: OP clear, TM's clear, neck supple PULM: CTA B, normal percussion CV: RRR, no mgr, trace edema GI: BS+, soft, nontender Derm: no cyanosis or rash Psyche: normal mood and affect    CBC    Component Value Date/Time   WBC 8.4 08/30/2017 1937   RBC 4.17 08/30/2017 1937   HGB 11.8 (L) 08/30/2017 1937   HCT 37.3 08/30/2017 1937   PLT 237 08/30/2017 1937   MCV 89.4 08/30/2017 1937   MCH 28.3 08/30/2017 1937   MCHC 31.6 08/30/2017 1937   RDW 18.8 (H) 08/30/2017 1937   LYMPHSABS 1.8 08/30/2017 1937   MONOABS 0.5 08/30/2017 1937   EOSABS 0.4 08/30/2017 1937   BASOSABS 0.0 08/30/2017 1937     Chest imaging: June 2018 CT chest images independently reviewed showing essentially normal pulmonary parenchyma, I question whether or not there is centrilobular groundglass versus air trapping, not a high-resolution CT. August 2018 CT angiogram chest showed no pulmonary embolism 10/2017 HRCT images independently reviewed: Pulmonary arterial enlargement, subcarinal node stable at 1.6 cm, mosaicism noted which persist with  expiratory images  PFT: September 2019 ratio 86%, FVC 2.80 L 67% predicted, total lung capacity 4.6 L 78% predicted, DLCO 18.4 mL 59% predicted  Labs:  Path:  walk: October 2019 6-minute walk test: 192 m, O2 saturation nadir 92% on 4 L nasal cannula I just saw her for the first time she was sent  Echo: June 2019 transthoracic echocardiogram says technically difficult study, LVEF normal  60 to 65%, pseudo-normal LV filling pattern, mildly enlarged right ventricle with normal systolic function, left atrium dilated, moderate concentric LVH, moderate tricuspid regurgitation, RVSP is 59 mmHg echo shows evidence of increased right atrial pressure  Heart Catheterization:  Sleep study: 09/2017: attempted, didn't sleep well, but O2 saturation dropped < 88% for more than 15 minutes October 2019 CPAP titration study: Optimal pressure 17 cm of water, O2 saturation at this pressure 86% on room air     Assessment & Plan:   Pulmonary HTN (HCC)  Obstructive sleep apnea  Chronic diastolic congestive heart failure (HCC)  Chronic respiratory failure with hypoxia (HCC)  Discussion: This has been a stable interval for Ms. McNeil.  She has [employer] group 2 and 3 pulmonary hypertension, I seriously doubt that she has pulmonary arteriopathy independent of her diastolic heart failure and obstructive sleep apnea.  That being said, should she have progression of symptoms such as leg swelling or shortness of breath over the next several months a right heart catheterization may be of value.  Plan: Obstructive sleep apnea: As soon as possible try to resume CPAP 17 cm of water at night with 2 L of oxygen No driving while feeling sleepy  Diastolic heart failure: Continue taking lisinopril, furosemide, Coreg, and Diamox as directed by the cardiology clinic  Pulmonary hypertension: At this time I do not think you need to have any other test for this but if you develop more shortness of breath or leg swelling let me know and we can try to make arrangements for right heart catheterization  Follow-up with me in 6 months or sooner if any breathing problems develop     Current Outpatient Medications:  .  acetaZOLAMIDE (DIAMOX) 500 MG capsule, Take 1 capsule (500 mg total) by mouth 2 (two) times daily., Disp: 60 capsule, Rfl: 12 .  carvedilol (COREG) 3.125 MG tablet, Take 3.125 mg  by mouth 2 (two) times daily., Disp: , Rfl: 3 .  CINNAMON PO, Take 1,000 mg by mouth daily., Disp: , Rfl:  .  furosemide (LASIX) 40 MG tablet, Take 40 mg by mouth daily. May increase to 60mg  if weight up 3 pounds, Disp: , Rfl:  .  glipiZIDE (GLUCOTROL XL) 5 MG 24 hr tablet, Take 5 mg by mouth daily with breakfast., Disp: , Rfl:  .  lisinopril (PRINIVIL,ZESTRIL) 5 MG tablet, Take 5 mg by mouth daily., Disp: , Rfl: 6 .  sitaGLIPtin-metformin (JANUMET) 50-1000 MG tablet, Take 1 tablet by mouth daily., Disp: , Rfl:  .  rosuvastatin (CRESTOR) 20 MG tablet, Take 1 tablet (20 mg total) by mouth daily., Disp: 90 tablet, Rfl: 1

## 2018-04-12 NOTE — Patient Instructions (Signed)
Obstructive sleep apnea: As soon as possible try to resume CPAP 17 cm of water at night with 2 L of oxygen No driving while feeling sleepy  Diastolic heart failure: Continue taking lisinopril, furosemide, Coreg, and Diamox as directed by the cardiology clinic  Pulmonary hypertension: At this time I do not think you need to have any other test for this but if you develop more shortness of breath or leg swelling let me know and we can try to make arrangements for right heart catheterization  Follow-up with me in 6 months or sooner if any breathing problems develop

## 2018-04-17 ENCOUNTER — Telehealth: Payer: Self-pay | Admitting: *Deleted

## 2018-04-17 NOTE — Telephone Encounter (Signed)
Received clearance request for pt for dental appt (extractions, x rays, prophylaxis, fillings, local anesthesia).  We see pt for IIH.  Faxed signed form to [phone] (with confirmation).   [phone] ofv.

## 2018-05-31 ENCOUNTER — Telehealth: Payer: Self-pay | Admitting: *Deleted

## 2018-05-31 NOTE — Telephone Encounter (Signed)
Called patient and advised due to current COVID 19 pandemic, our office is severely reducing in person visits in order to minimize the risk to our patients and healthcare providers. We recommend to convert your appointment to a video visit.  She has no capability; she consented to a telephone visit. Rescheduled her for sooner, update EMR. Patient verbalized understanding, appreciation.

## 2018-06-05 ENCOUNTER — Ambulatory Visit (INDEPENDENT_AMBULATORY_CARE_PROVIDER_SITE_OTHER): Payer: Medicaid Other | Admitting: Diagnostic Neuroimaging

## 2018-06-05 ENCOUNTER — Encounter: Payer: Self-pay | Admitting: Diagnostic Neuroimaging

## 2018-06-05 ENCOUNTER — Other Ambulatory Visit: Payer: Self-pay

## 2018-06-05 DIAGNOSIS — G932 Benign intracranial hypertension: Secondary | ICD-10-CM | POA: Diagnosis not present

## 2018-06-05 MED ORDER — ACETAZOLAMIDE ER 500 MG PO CP12
500.0000 mg | ORAL_CAPSULE | Freq: Two times a day (BID) | ORAL | 4 refills | Status: DC
Start: 2018-06-05 — End: 2022-01-18

## 2018-06-05 NOTE — Progress Notes (Signed)
      Virtual Visit via Telephone Note  I connected with Emily Mooney on 06/05/18 at  3:00 PM EDT by telephone and verified that I am speaking with the correct person using two identifiers.   I discussed the limitations, risks, security and privacy concerns of performing an evaluation and management service by telephone and the availability of in person appointments. I also discussed with the patient that there may be a patient responsible charge related to this service. The patient expressed understanding and agreed to proceed.   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 Petra Kuba, [facility]) --> s/p shunt 03/16/18 - follow up with ophthalmology [facility]) - follow up with PCP, cardiology, pulmonary  OBESITY - I recommend bariatric surgery evaluation; patient will discuss with PCP and cardiology   Follow Up Instructions:  - Return in about 6 months (around 12/05/2018).    I discussed the assessment and treatment plan with the patient. The patient was provided an opportunity to ask questions and all were answered. The patient agreed with the plan and demonstrated an understanding of the instructions.   The patient was advised to call back or seek an in-person evaluation if the symptoms worsen or if the condition fails to improve as anticipated.  I provided 10 minutes of non-face-to-face time during this encounter.    Suanne Marker, MD 06/05/2018, 3:18 PM Certified in Neurology, Neurophysiology and Neuroimaging  [facility] [address]  [place], [place] [address] [phone]

## 2018-06-11 ENCOUNTER — Other Ambulatory Visit: Payer: Self-pay | Admitting: Cardiology

## 2018-06-12 NOTE — Telephone Encounter (Signed)
Rx refill sent to pharmacy. 

## 2018-06-13 ENCOUNTER — Ambulatory Visit: Payer: Medicaid Other | Admitting: Diagnostic Neuroimaging

## 2018-09-03 ENCOUNTER — Other Ambulatory Visit: Payer: Self-pay | Admitting: Cardiology

## 2018-09-16 ENCOUNTER — Other Ambulatory Visit: Payer: Self-pay | Admitting: Cardiology

## 2019-04-14 IMAGING — CT CT HEAD W/O CM
4 series · 16 of 47 positions shown, 18 images · non-contrast
Comparison: 08/08/2016

CLINICAL DATA: Idiopathic intracranial hypertension with
progressive visual loss over the past 2 months.

EXAM:
CT HEAD WITHOUT CONTRAST
TECHNIQUE: Contiguous axial images were obtained from the base of the skull
through the vertex without intravenous contrast.

[Series 2: head without · axial · non-contrast · 0.43mm/px · z∈[-164,-44]mm · 7 of 33 slices shown, 9 images]
[im 5/33  brain]
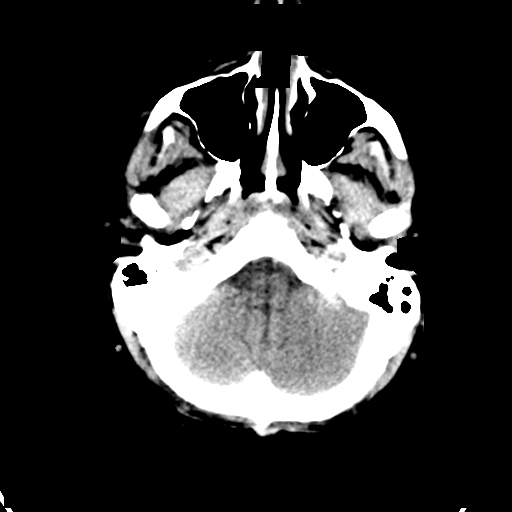
[im 5/33  bone]
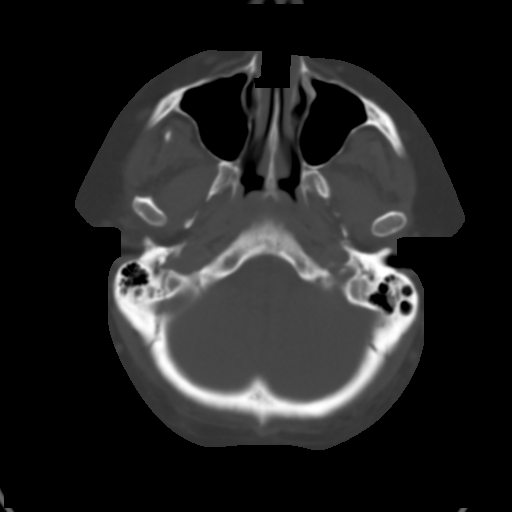
[im 9/33  brain]
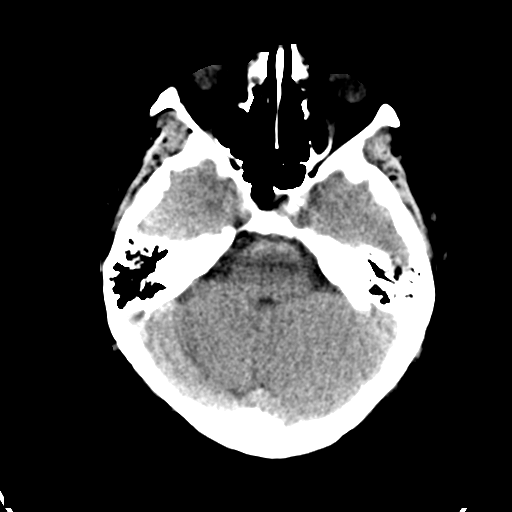
[im 13/33  brain]
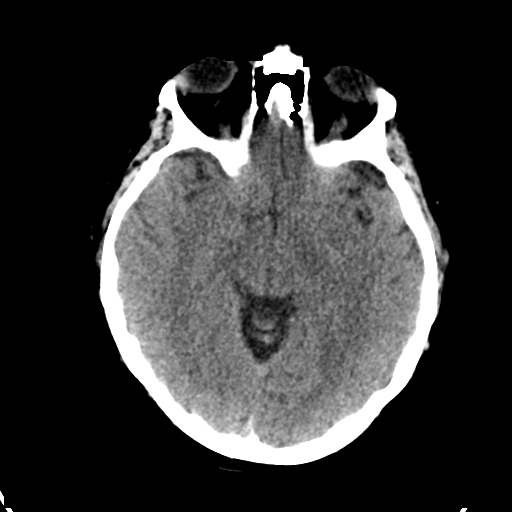
[im 17/33  brain]
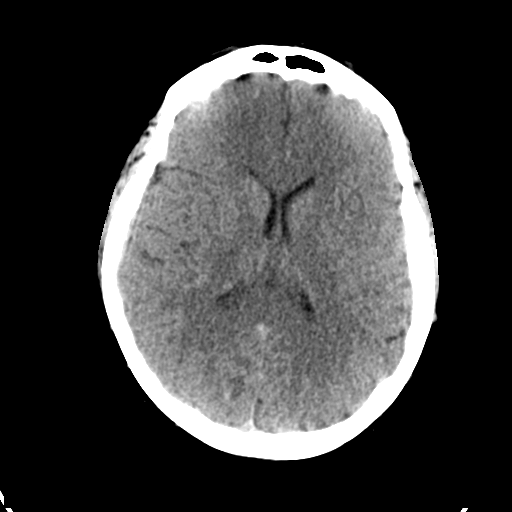
[im 21/33  brain]
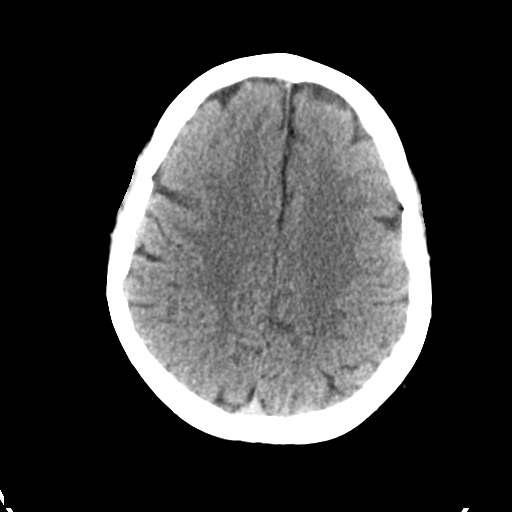
[im 21/33  bone]
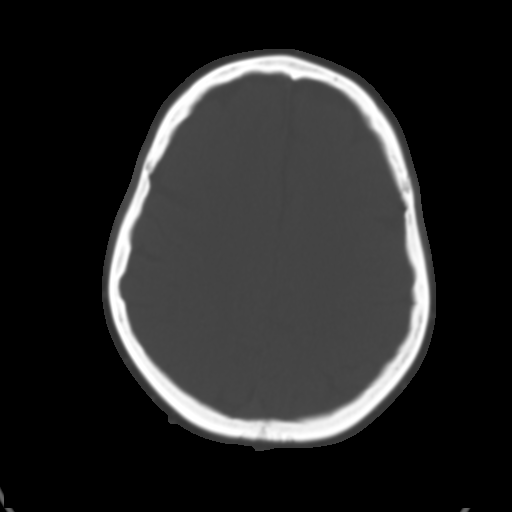
[im 25/33  brain]
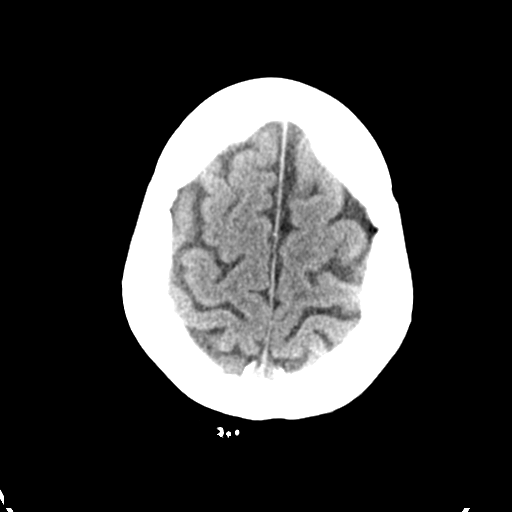
[im 29/33  brain]
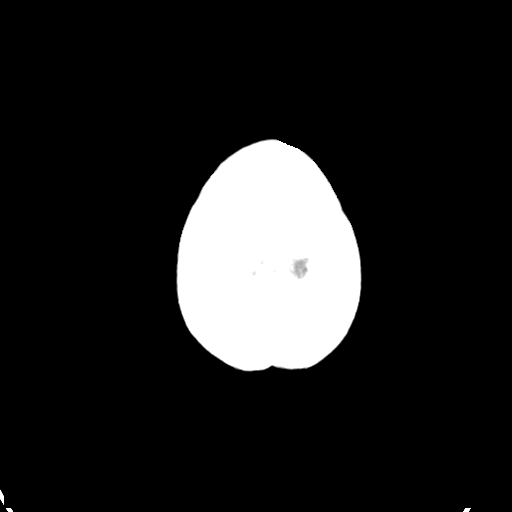

[Series 3: head bone · axial · 0.43mm/px · z∈[-168,-136]mm · 3 of 83 slices shown]
[im 9/83  bone]
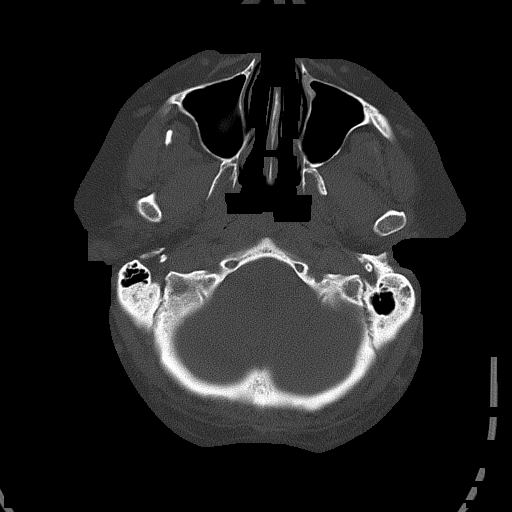
[im 17/83  bone]
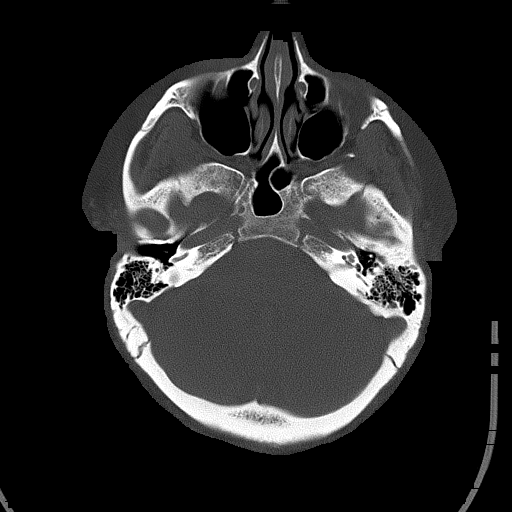
[im 25/83  bone]
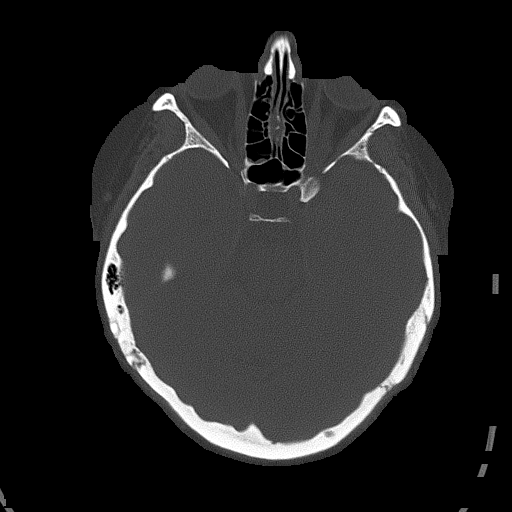

[Series 4: head without cor · coronal · non-contrast · 0.32mm/px · 3 of 70 slices shown]
[im 24/70  brain]
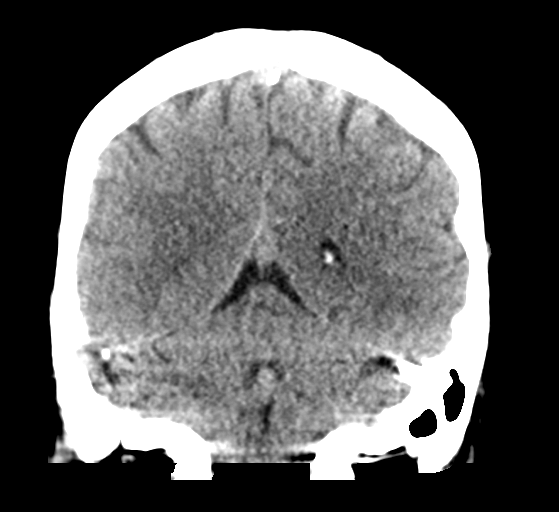
[im 31/70  brain]
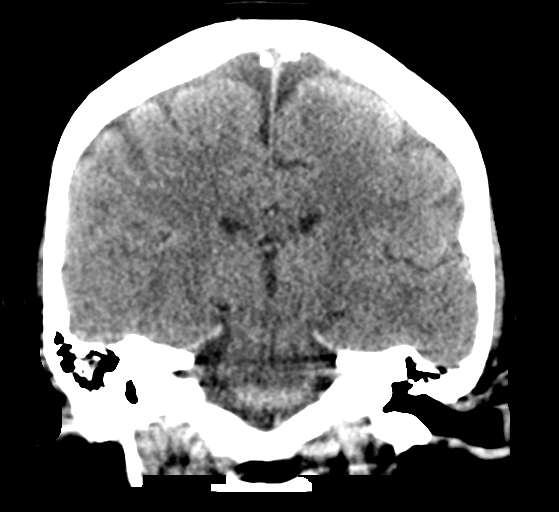
[im 39/70  brain]
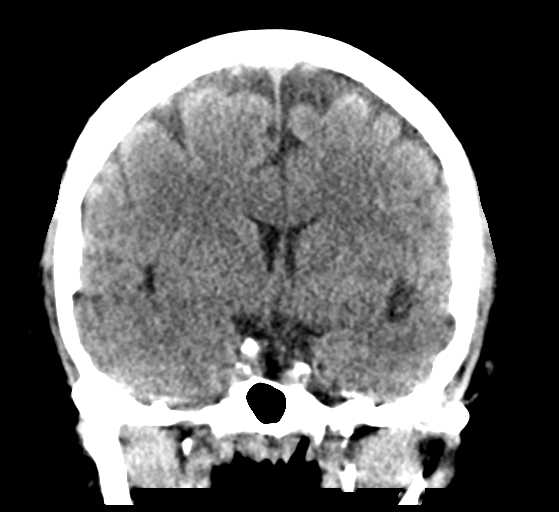

[Series 5: head without sag · sagittal · non-contrast · 0.32mm/px · 3 of 58 slices shown]
[im 20/58  brain]
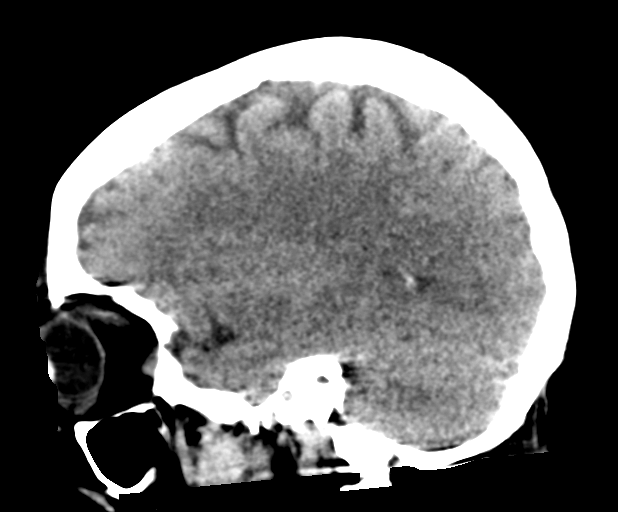
[im 29/58  brain]
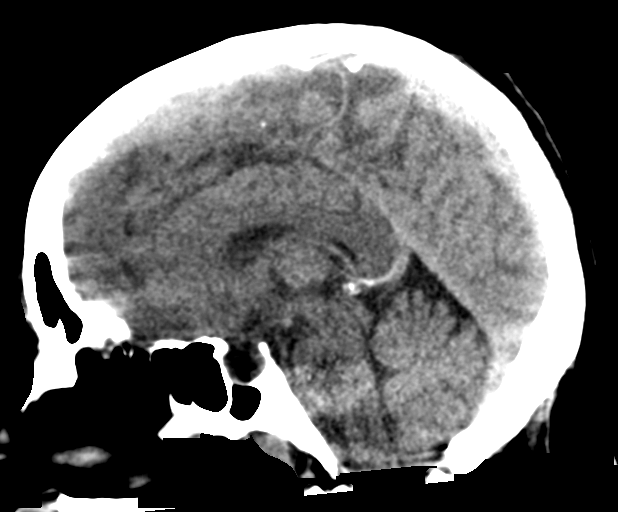
[im 39/58  brain]
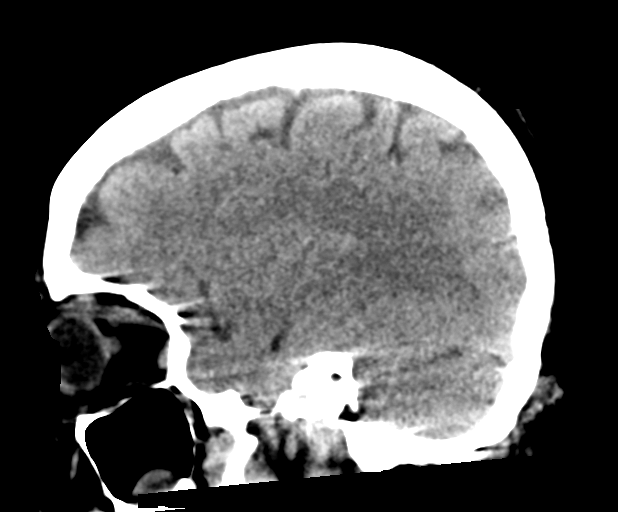

[16 of 47 positions shown; findings below may reference images not displayed]

FINDINGS: Brain: Slight flattening of the posterior globes with slight kinking
of the optic nerves can be seen in the setting of idiopathic
intracranial hypertension (IIH). Additionally there is a partially
empty pituitary sella which can also be seen in the setting of IIH.
No acute intracranial hemorrhage, midline shift or edema. No large
vascular territory infarct. No intra-axial mass nor extra-axial
fluid collections. No hydrocephalus or slit-like ventricles.

Vascular: No hyperdense vessel sign.

Skull: Normal

Sinuses/Orbits: Clear paranasal sinuses and mastoids. Flattening of
the posterior aspect of the globes and slight kinking of the optic
nerves as above described.

Other: None
IMPRESSION: Stigmata of idiopathic intracranial hypertension with slight kinking
of the optic nerves bilaterally, flattening of the posterior aspect
of the globes and anterior partially empty appearing pituitary
sella.

## 2020-11-26 ENCOUNTER — Ambulatory Visit (INDEPENDENT_AMBULATORY_CARE_PROVIDER_SITE_OTHER): Payer: Medicare HMO | Admitting: Sports Medicine

## 2020-11-26 ENCOUNTER — Encounter: Payer: Self-pay | Admitting: Sports Medicine

## 2020-11-26 ENCOUNTER — Other Ambulatory Visit: Payer: Self-pay

## 2020-11-26 DIAGNOSIS — B351 Tinea unguium: Secondary | ICD-10-CM

## 2020-11-26 DIAGNOSIS — M79675 Pain in left toe(s): Secondary | ICD-10-CM | POA: Diagnosis not present

## 2020-11-26 DIAGNOSIS — M79674 Pain in right toe(s): Secondary | ICD-10-CM | POA: Diagnosis not present

## 2020-11-26 DIAGNOSIS — B070 Plantar wart: Secondary | ICD-10-CM

## 2020-11-26 DIAGNOSIS — E1141 Type 2 diabetes mellitus with diabetic mononeuropathy: Secondary | ICD-10-CM

## 2020-11-26 NOTE — Patient Instructions (Signed)
Nervive nerve relief supplement for your nerves can be purchased OTC at [facility]  

## 2020-11-26 NOTE — Progress Notes (Signed)
Subjective: Emily Mooney is a 55 y.o. female patient with history of diabetes who presents to office today complaining of long,mildly painful nails  while ambulating in shoes; unable to trim. Patient states that the glucose reading this morning was 120 mg/dl. Patient denies any new changes in medication or new problems. Patient also admits that she has a lesion that looks similar to a flesh-colored wart that has been here for over 15 years that sometimes is tender to touch states that it is staying about the same size and does not recall stepping on anything.  Patient denies any other pedal complaints at this time.  Patient Active Problem List   Diagnosis Date Noted   Diastolic congestive heart failure (HCC) 08/23/2017   Morbid obesity (HCC) 08/23/2017   Acute on chronic diastolic congestive heart failure (HCC) 08/09/2016   Acute respiratory distress 08/07/2016   Pulmonary HTN (HCC) 08/07/2016   Benign intracranial hypertension 06/06/2013   Diabetes (HCC) 06/06/2013   Hypertension 05/17/2013   Obstructive sleep apnea 05/17/2013   Current Outpatient Medications on File Prior to Visit  Medication Sig Dispense Refill   glucose blood (ACCU-CHEK AVIVA PLUS) test strip      lisinopril (ZESTRIL) 10 MG tablet 10 mg.     acetaZOLAMIDE (DIAMOX) 500 MG capsule Take 1 capsule (500 mg total) by mouth 2 (two) times daily. 180 capsule 4   butalbital-acetaminophen-caffeine (FIORICET, ESGIC) 50-325-40 MG tablet TAKE 1 TABLET BY MOUTH EVERY 4 HOURS AS NEEDED FOR HEADACHE FOR UP TO 7 DAYS     carvedilol (COREG) 3.125 MG tablet Take 3.125 mg by mouth 2 (two) times daily.  3   dapagliflozin propanediol (FARXIGA) 10 MG TABS tablet Take 10 mg by mouth daily.     furosemide (LASIX) 40 MG tablet Take 40 mg by mouth daily. May increase to 60mg  if weight up 3 pounds     glipiZIDE (GLUCOTROL XL) 5 MG 24 hr tablet Take 5 mg by mouth daily with breakfast.     lisinopril (PRINIVIL,ZESTRIL) 5 MG tablet Take 5 mg by mouth  daily.  6   lovastatin (MEVACOR) 40 MG tablet 20 mg.     metFORMIN (GLUCOPHAGE) 850 MG tablet 850 mg.     rosuvastatin (CRESTOR) 20 MG tablet Take 1 tablet by mouth once daily 90 tablet 0   sitaGLIPtin-metformin (JANUMET) 50-1000 MG tablet Take 1 tablet by mouth daily.     No current facility-administered medications on file prior to visit.   Allergies  Allergen Reactions   Depo-Medrol [Methylprednisolone] Anaphylaxis    No results found for this or any previous visit (from the past 2160 hour(s)).  Objective: General: Patient is awake, alert, and oriented x 3 and in no acute distress.  Integument: Skin is warm, dry and supple bilateral. Nails are tender, minimally elongated thickened and  dystrophic with subungual debris, consistent with onychomycosis, 1-5 bilateral.  There is also mild incurvation noted bilateral hallux nails but no acute ingrowing or no signs of infection. No open lesions or preulcerative lesions present bilateral. Remaining integument unremarkable.  There is a raised soft tissue lesion that measures less than 1 cm at the plantar central heel that is flesh-colored likely resembling wart or skin tag  Vasculature:  Dorsalis Pedis pulse 1/4 bilateral. Posterior Tibial pulse 1/4 bilateral. Capillary fill time <3 sec 1-5 bilateral. Positive hair growth to the level of the digits.Temperature gradient within normal limits. No varicosities present bilateral. No edema present bilateral.   Neurology: The patient has slightly diminished sensation  measured with a 5.07/10g Semmes Weinstein Monofilament at all pedal sites bilateral . Vibratory sensation diminished bilateral with tuning fork. No Babinski sign present bilateral.  Admits some occasional sensations to the toes difficult to explain but toe sometimes gets sensitive.  Musculoskeletal:Asymptomatic pes planus pedal deformities noted bilateral. Muscular strength 5/5 in all lower extremity muscular groups bilateral without pain on  range of motion . No tenderness with calf compression bilateral.  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 -Discussed and educated patient on diabetic foot care, especially with  regards to the vascular, neurological and musculoskeletal systems.  -Stressed the importance of good glycemic control and the detriment of not  controlling glucose levels in relation to the foot. -Mechanically debrided all nails 1-5 bilateral using sterile nail nipper and filed with dremel without incident  -Discussed treatment options for flesh-colored lesion at the bottom of the left foot likely consistent with wart; applied a small amount of Cantharone to the area and advised patient of blistering reaction and once occurs to apply Neosporin and Band-Aid as instructed -Advised patient if lesion remains the same at next visit and does not change he may benefit from [place] to further discuss AAOx3 plan out a biopsy of the lesion however we will proceed with caution since patient is diabetic -Answered all patient questions -Recommend patient to consider taking over-the-counter Nervive nerve relief for any abnormal sensations to the toes -Patient to return  in 3 months for at risk foot care -Patient advised to call the office if any problems or questions arise in the meantime.  Asencion Islam, DPM

## 2020-12-13 ENCOUNTER — Other Ambulatory Visit: Payer: Self-pay | Admitting: Family

## 2020-12-22 ENCOUNTER — Other Ambulatory Visit: Payer: Self-pay | Admitting: Family

## 2021-01-15 ENCOUNTER — Other Ambulatory Visit: Payer: Self-pay | Admitting: Family

## 2021-01-20 ENCOUNTER — Encounter: Payer: Self-pay | Admitting: Diagnostic Neuroimaging

## 2021-01-20 ENCOUNTER — Ambulatory Visit (INDEPENDENT_AMBULATORY_CARE_PROVIDER_SITE_OTHER): Payer: Medicare HMO | Admitting: Diagnostic Neuroimaging

## 2021-01-20 ENCOUNTER — Other Ambulatory Visit: Payer: Self-pay

## 2021-01-20 DIAGNOSIS — G932 Benign intracranial hypertension: Secondary | ICD-10-CM

## 2021-01-20 NOTE — Progress Notes (Addendum)
[facility]  PATIENT: Emily Mooney DOB: [birth year]  REFERRING CLINICIAN: Dulce Sellar, NP HISTORY FROM: patient REASON FOR VISIT: follow up   HISTORICAL  CHIEF COMPLAINT:  Chief Complaint  Patient presents with   Headache    Rm 7 FU for worsening headaches, "haven't taken Diamox in a long time"     HISTORY OF PRESENT ILLNESS:   UPDATE (01/20/21, VRP): Since last visit, doing well until last month had more headaches (had daily coughing x 1 month). Now coughing and HA are resolved.   UPDATE (06/05/18, VRP):  - patient is doing well; now s/p VP shunt in 03/16/18 - has had follow up with ophthalmology also and papilledema is improving - HA stable - tolerating acetazolamide  UPDATE (12/05/17, VRP): Since last visit, doing about the same.  Continues with 3 headaches per week.  Vision is stable.  Tolerating acetazolamide.  Since last visit she lost her previous insurance and now is on Medicaid.  Therefore she was not able to see ophthalmology or neurosurgery.  She continues to deal with congestive heart failure and pulmonary hypertension.  She is on chronic oxygen.  She has a difficult time seeing.  She is not able to drive a car.  She is planning to apply for disability.  She was previously a CMA.  NEW HPI (09/02/17, VRP): 55 year old female with history of heart failure, obesity, pseudotumor cerebri, here for evaluation.  I reviewed prior neurology notes which are referred to below.  Patient was initially diagnosed with pseudotumor cerebri in 2004, tried on Topamax and Diamox in the past.  She was referred for bariatric surgery as well as VP shunt placement in the past but could not afford these due to financial limitations.  In the past 1 year patient was not able to afford her medications and therefore stopped taking her medications.  Patient symptoms have worsened.  3 months ago she started to have increasing blurred vision, transient visual obscurations,  ultimately going to the emergency room on 08/30/2017.  Lumbar puncture was obtained which showed opening pressure of 54 cm of water.  Closing pressure was 20 cm water.  Patient was prescribed acetazolamide but she did not start this as she was not able to afford her medication.  Patient having some mild headaches and tinnitus.   PRIOR RECORDS PER AMY KEARNS, PA-c:  "[name] is a 55 y.o. female seen at [facility] for a follow up visit for the following: Pseudotumor cerebri. The onset of the blurring of vision has been sudden and has been occurring in an intermittent (mostly notices blurred vision when writing) pattern for months (started in January). The course has been recurrent. The blurring of vision is characterized as painless and a blurring of vision. The symptoms have been associated with diabetes mellitus, while the symptoms have not been associated with diplopia or headache. Note for "Blurring of vision": [name] was referred to our office by her optometrist due to recent exam findings of bilateral papilledema. Her PMH is significant for pseudotumor cerebri. She was previously diagnosed with this around 2004 (treated by Dr. Orie Rout). She denies ever having a scan of her brain and no records are available. Past treatment for the pseudotumor cerebri includes a large volume spinal tap, Diamox, and Topamax. She is unsure how long ago she stopped taking the medications. Denies any recent weight gain. She actually reports a weight loss of 56 lbs since January of this year.  Update 06/06/13 (above notes are  from initial evaluation). Unique is here today for follow up after having a MRI of her brain. The results of the MRI were reviewed with her (under Diagnostic Studies). She reports some improvement in her blurred vision. She is now getting a headache every time she coughs.  Update 07/25/13. She is here today for follow up after having a LP on 07/05/13. Her opening pressure of  37 cm was diagnostic of pseudotumor cerebri. She was started on Diamox 250 mg, 2 pills tid after the LP. She denies having any side effects from the medication but has noticed an increased frequency in urinating. She reports having 5-6 headaches per week. The headaches last only a few seconds and are described as sharp. She does not take anything for acute treatment of the headaches since they only last a few seconds. She has not returned to the optometrist for a recheck.  Update 08/29/13. She had a dilated eye exam on 08/02/13. Per Dr. Guillermina [place] note she still had subtle papilledema (OD > OS). She has been taking the Diamox as directed. She is still having some blurred vision and brief headaches daily. She has lost 8 lbs since her last appointment.   Update 10/10/13. Topamax was added at the last appointment. She is now taking 75 mg with no side effects reported. She reports that her headaches have stopped. She is also no longer having any blurred vision. Her weight is down 4 lbs since her last appointment.   Update 12/12/13. After her last appointment she was evaluated by optometry again who found that she still had some mild papilledema. The dose of Topamax was then increased to 150 mg. The only side effect reported is intermittent paresthesias in her hands and feet. She also has experienced some dizziness but is not sure if it is related to the Topamax. She still has intermittent blurred vision and brief sharp headaches. She has not lost any more weight.   Update 02/13/14. At the last appointment the dose of Topamax was increased to 200 mg and she was also referred to bariatric surgery. Unfortunately her insurance will not cover bariatric surgery. She denies having any side effects from the increased dose of Topamax. She reports having blurred vision but only when reading. She reports that the frequency of her brief headaches has decreased. She was evaluated again by optometry on 12/21/13. She was found to  still have bilateral papilledema. Her weight is down 10 lbs since her last appointment.   Update 04/10/14. The dose of Topamax was increased to 300 mg daily at the last appointment. She had a repeat LP on 03/08/14. Her opening pressure was 29 which was down from 37 in 06/2013. The dose of Topamax was then increased to 400 mg daily. On 03/21/14 she had formal VF testing which showed evidence of apparent inferior binasal hemianopia. Her weight today in the office remains at 330 lbs.   Update 06/20/14. At the last appointment she was referred to Neurosurgery for evaluation regarding spinal fluid shunt. Unfortunately her current health insurance does not cover surgical procedures. Today her weight is at 334 lbs. She denies having any headaches or visual changes.  Update 10/30/14. Today she complains of having blurred vision with frequent "flash headaches" that last only a few minutes. She has not had an eye exam since February of this year. She did not follow up with Neurosurgery because her insurance at that time would not pay for surgery. She recently got a new job and now has Medicaid.  She continues to take Topamax and Diamox as prescribed.   Update 03/24/16. It has been over a year since patient was last seen in the office. Patient had an eye exam yesterday. Notes from [facility] care reviewed. Exam revealed papilledema in both eyes. She reports stopping both Topamax and Diamox about a year ago due to costs. Her health insurance does not cover medications or office visits. Patient has also gained approximately 70 lbs since her last visit. She does not cook much and eats take out most of the time."   REVIEW OF SYSTEMS: Full 14 system review of systems performed and negative with exception of: Blurred vision fatigue.   ALLERGIES: Allergies  Allergen Reactions   Depo-Medrol [Methylprednisolone] Anaphylaxis    HOME MEDICATIONS: Outpatient Medications Prior to Visit  Medication Sig Dispense Refill   carvedilol  (COREG) 3.125 MG tablet Take 3.125 mg by mouth 2 (two) times daily.  3   dapagliflozin propanediol (FARXIGA) 10 MG TABS tablet Take 10 mg by mouth daily.     fenofibrate (TRICOR) 145 MG tablet Take 145 mg by mouth daily.     furosemide (LASIX) 40 MG tablet Take 40 mg by mouth daily. May increase to 60mg  if weight up 3 pounds     glipiZIDE (GLUCOTROL) 10 MG tablet Take 10 mg by mouth daily before breakfast.     lisinopril (PRINIVIL,ZESTRIL) 5 MG tablet Take 5 mg by mouth daily.  6   metFORMIN (GLUCOPHAGE) 500 MG tablet Take 500 mg by mouth daily.     rosuvastatin (CRESTOR) 20 MG tablet Take 1 tablet by mouth once daily 90 tablet 0   acetaZOLAMIDE (DIAMOX) 500 MG capsule Take 1 capsule (500 mg total) by mouth 2 (two) times daily. (Patient not taking: Reported on 01/20/2021) 180 capsule 4   butalbital-acetaminophen-caffeine (FIORICET, ESGIC) 50-325-40 MG tablet TAKE 1 TABLET BY MOUTH EVERY 4 HOURS AS NEEDED FOR HEADACHE FOR UP TO 7 DAYS (Patient not taking: Reported on 01/20/2021)     glucose blood (ACCU-CHEK AVIVA PLUS) test strip  (Patient not taking: Reported on 01/20/2021)     sitaGLIPtin-metformin (JANUMET) 50-1000 MG tablet Take 1 tablet by mouth daily. (Patient not taking: Reported on 01/20/2021)     glipiZIDE (GLUCOTROL XL) 5 MG 24 hr tablet Take 5 mg by mouth daily with breakfast.     lisinopril (ZESTRIL) 10 MG tablet 10 mg.     lovastatin (MEVACOR) 40 MG tablet 20 mg.     metFORMIN (GLUCOPHAGE) 850 MG tablet 850 mg.     No facility-administered medications prior to visit.    PAST MEDICAL HISTORY: Past Medical History:  Diagnosis Date   CHF (congestive heart failure) (HCC)    Diabetes mellitus without complication (HCC)    Headache    IIH (idiopathic intracranial hypertension)    Numbness in feet     PAST SURGICAL HISTORY: Past Surgical History:  Procedure Laterality Date   TOOTH EXTRACTION     TUBAL LIGATION      FAMILY HISTORY: Family History  Problem Relation Age of Onset    Cancer Father     SOCIAL HISTORY:  Social History   Socioeconomic History   Marital status: Single    Spouse name: Not on file   Number of children: 2   Years of education: Not on file   Highest education level: Not on file  Occupational History   Not on file  Tobacco Use   Smoking status: Former    Packs/day: 2.00  Types: Cigarettes    Quit date: 04/12/2004    Years since quitting: 16.7   Smokeless tobacco: Never  Substance and Sexual Activity   Alcohol use: Not Currently   Drug use: Never   Sexual activity: Not on file  Other Topics Concern   Not on file  Social History Narrative   Lives alone   Caffeine- soda a day   Social Determinants of Health   Financial Resource Strain: Not on file  Food Insecurity: Not on file  Transportation Needs: Not on file  Physical Activity: Not on file  Stress: Not on file  Social Connections: Not on file  Intimate Partner Violence: Not on file     PHYSICAL EXAM  GENERAL EXAM/CONSTITUTIONAL: Vitals:  Vitals:   01/20/21 1410  BP: 137/84  Pulse: 84  Weight: (!) 388 lb 9.6 oz (176.3 kg)  Height: 5\' 9"  (1.753 m)   Wt Readings from Last 3 Encounters:  01/20/21 (!) 388 lb 9.6 oz (176.3 kg)  04/12/18 (!) 366 lb (166 kg)  01/09/18 (!) 364 lb 9.6 oz (165.4 kg)   Body mass index is 57.39 kg/m. No results found. Patient is in no distress; well developed, nourished and groomed; neck is supple  CARDIOVASCULAR: Examination of carotid arteries is normal; no carotid bruits Regular rate and rhythm, no murmurs Examination of peripheral vascular system by observation and palpation is normal  EYES: Ophthalmoscopic exam of optic discs and posterior segments is UNREMARKABLE  MUSCULOSKELETAL: Gait, strength, tone, movements noted in Neurologic exam below  NEUROLOGIC: MENTAL STATUS:  No flowsheet data found. awake, alert, oriented to person, place and time recent and remote memory intact normal attention and  concentration language fluent, comprehension intact, naming intact,  fund of knowledge appropriate  CRANIAL NERVE:  2nd - NO PAPILEDEMA 2nd, 3rd, 4th, 6th - pupils equal and SLUGGISH to light, visual fields full to confrontation, extraocular muscles intact, no nystagmus; DECR PERIPHERAL VISION IN RIGHT EYE; CAN COUNT CENTRALLY; LEFT EYE NORMAL 5th - facial sensation symmetric 7th - facial strength symmetric 8th - hearing intact 9th - palate elevates symmetrically, uvula midline 11th - shoulder shrug symmetric 12th - tongue protrusion midline  MOTOR:  normal bulk and tone, full strength in the BUE, BLE  SENSORY:  normal and symmetric to light touch, temperature, vibration  COORDINATION:  finger-nose-finger, fine finger movements normal  REFLEXES:  deep tendon reflexes TRACE and symmetric; ABSENT AT ANKLES  GAIT/STATION:  narrow based gait    DIAGNOSTIC DATA (LABS, IMAGING, TESTING) - I reviewed patient records, labs, notes, testing and imaging myself where available.  Lab Results  Component Value Date   WBC 8.4 08/30/2017   HGB 11.8 (L) 08/30/2017   HCT 37.3 08/30/2017   MCV 89.4 08/30/2017   PLT 237 08/30/2017      Component Value Date/Time   NA 138 10/04/2017 1103   K 4.3 10/04/2017 1103   CL 101 10/04/2017 1103   CO2 21 10/04/2017 1103   GLUCOSE 141 (H) 10/04/2017 1103   GLUCOSE 163 (H) 08/30/2017 1937   BUN 28 (H) 10/04/2017 1103   CREATININE 1.28 (H) 10/04/2017 1103   CALCIUM 9.5 10/04/2017 1103   GFRNONAA 49 (L) 10/04/2017 1103   GFRAA 56 (L) 10/04/2017 1103   No results found for: CHOL, HDL, LDLCALC, LDLDIRECT, TRIG, CHOLHDL No results found for: 10/06/2017 No results found for: VITAMINB12 No results found for: TSH   08/30/17 CT head [I reviewed images myself and agree with interpretation. -VRP]  - Stigmata of  idiopathic intracranial hypertension with slight kinking of the optic nerves bilaterally, flattening of the posterior aspect of the globes and  anterior partially empty appearing pituitary sella.     ASSESSMENT AND PLAN  55 y.o. year old female here with history of pseudotumor cerebri since 2004, with worsening symptoms in the last 3 months due to financial limitations and not taking her medications.  Patient having transient visual obscurations.  Dx:  1. Morbid obesity (HCC)       PLAN:  idiopathic intracranial hypertension (pseudotumor cerebri) - s/p VP shunting in 2020; off diamox for now and stable - follow up with NSGY (Dr. Petra Kuba) as needed - follow up with ophthalmology [facility]) as needed  OBESITY Body mass index is 57.39 kg/m.  - refer to healthy weight /mgmt clinic   Return for pending if symptoms worsen or fail to improve.      Suanne Marker, MD 01/20/2021, 3:11 PM Certified in Neurology, Neurophysiology and Neuroimaging  [facility] [address], [address] [place], [place] [address] [phone]

## 2021-01-20 NOTE — Patient Instructions (Signed)
idiopathic intracranial hypertension (pseudotumor cerebri) - s/p VP shunting in 2020; off diamox for now and stable - follow up with NSGY (Dr. Petra Kuba) as needed - follow up with ophthalmology [facility]) as needed  OBESITY Body mass index is 57.39 kg/m.  - refer to healthy weight /mgmt clinic

## 2021-02-25 ENCOUNTER — Other Ambulatory Visit: Payer: Self-pay | Admitting: Family

## 2021-03-04 ENCOUNTER — Ambulatory Visit: Payer: Medicare HMO | Admitting: Sports Medicine

## 2021-03-13 ENCOUNTER — Other Ambulatory Visit: Payer: Self-pay

## 2021-03-13 ENCOUNTER — Ambulatory Visit (INDEPENDENT_AMBULATORY_CARE_PROVIDER_SITE_OTHER): Payer: Medicare HMO | Admitting: Pulmonary Disease

## 2021-03-13 ENCOUNTER — Encounter: Payer: Self-pay | Admitting: Pulmonary Disease

## 2021-03-13 VITALS — BP 132/70 | HR 77 | Temp 98.3°F | Ht 70.0 in | Wt 365.6 lb

## 2021-03-13 DIAGNOSIS — I5030 Unspecified diastolic (congestive) heart failure: Secondary | ICD-10-CM | POA: Diagnosis not present

## 2021-03-13 DIAGNOSIS — R0602 Shortness of breath: Secondary | ICD-10-CM | POA: Diagnosis not present

## 2021-03-13 NOTE — Progress Notes (Signed)
Emily Mooney    YR:[id number]    10/10/1965  Primary Care Physician:No primary care provider on file.  Referring Physician: No referring provider defined for this encounter.  Chief complaint:   Consult for chronic respiratory failure with hypoxia, obstructive sleep apnea, pulmonary hypertension  HPI: 56 year old with history of chronic respiratory failure, hypoxia, obstructive sleep apnea, pulmonary hypertension, pseudotumor cerebri status post VP shunt She was previously followed by Dr. 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  She has not followed up in clinic since 2020  She has been noncompliant with her medication due to change in insurance and providers.  Per patient she had a couple of hospitalizations at [facility] for heart failure requiring Lasix therapy.  She was also off oxygen for a while and had to restart She is on CPAP for OSA but stopped in January 2020 when she had a VP shunt placed as it was interfering with the incision healing but never got restarted on it  She has followed up with her primary care who has reordered the sleep study which was done last week.  Results are still pending  Pets: No pets Occupation: Retired Tourist information centre manager Exposures: No mold, hot tub, Jacuzzi.  No feather pillows or comforters Smoking history: 36-pack-year smoking history.  Quit at age 32 Travel history: No significant travel history Relevant family history: No family history of lung disease  Outpatient Encounter Medications as of 03/13/2021  Medication Sig   acetaZOLAMIDE (DIAMOX) 500 MG capsule Take 1 capsule (500 mg total) by mouth 2 (two) times daily.   butalbital-acetaminophen-caffeine (FIORICET, ESGIC) 50-325-40 MG tablet    carvedilol (COREG) 3.125 MG tablet Take 3.125 mg by mouth 2 (two) times daily.   dapagliflozin propanediol (FARXIGA) 10 MG TABS tablet Take 10 mg by mouth daily.   fenofibrate (TRICOR) 145 MG tablet Take 145 mg by mouth  daily.   furosemide (LASIX) 40 MG tablet Take 40 mg by mouth daily. May increase to 60mg  if weight up 3 pounds   glipiZIDE (GLUCOTROL) 10 MG tablet Take 10 mg by mouth daily before breakfast.   glucose blood (ACCU-CHEK AVIVA PLUS) test strip    lisinopril (PRINIVIL,ZESTRIL) 5 MG tablet Take 5 mg by mouth daily.   metFORMIN (GLUCOPHAGE) 500 MG tablet Take 500 mg by mouth daily.   rosuvastatin (CRESTOR) 20 MG tablet Take 1 tablet by mouth once daily   sitaGLIPtin-metformin (JANUMET) 50-1000 MG tablet Take 1 tablet by mouth daily.   No facility-administered encounter medications on file as of 03/13/2021.    Allergies as of 03/13/2021 - Review Complete 03/13/2021  Allergen Reaction Noted   Depo-medrol [methylprednisolone] Anaphylaxis 11/11/2017    Past Medical History:  Diagnosis Date   CHF (congestive heart failure) (HCC)    Diabetes mellitus without complication (HCC)    Headache    IIH (idiopathic intracranial hypertension)    Numbness in feet     Past Surgical History:  Procedure Laterality Date   TOOTH EXTRACTION     TUBAL LIGATION      Family History  Problem Relation Age of Onset   Cancer Father     Social History   Socioeconomic History   Marital status: Single    Spouse name: Not on file   Number of children: 2   Years of education: Not on file   Highest education level: Not on file  Occupational History   Not on file  Tobacco Use  Smoking status: Former    Packs/day: 2.00    Types: Cigarettes    Quit date: 04/12/2004    Years since quitting: 16.9   Smokeless tobacco: Never  Substance and Sexual Activity   Alcohol use: Not Currently   Drug use: Never   Sexual activity: Not on file  Other Topics Concern   Not on file  Social History Narrative   Lives alone   Caffeine- soda a day   Social Determinants of Health   Financial Resource Strain: Not on file  Food Insecurity: Not on file  Transportation Needs: Not on file  Physical Activity: Not on file   Stress: Not on file  Social Connections: Not on file  Intimate Partner Violence: Not on file    Review of systems: Review of Systems  Constitutional: Negative for fever and chills.  HENT: Negative.   Eyes: Negative for blurred vision.  Respiratory: as per HPI  Cardiovascular: Negative for chest pain and palpitations.  Gastrointestinal: Negative for vomiting, diarrhea, blood per rectum. Genitourinary: Negative for dysuria, urgency, frequency and hematuria.  Musculoskeletal: Negative for myalgias, back pain and joint pain.  Skin: Negative for itching and rash.  Neurological: Negative for dizziness, tremors, focal weakness, seizures and loss of consciousness.  Endo/Heme/Allergies: Negative for environmental allergies.  Psychiatric/Behavioral: Negative for depression, suicidal ideas and hallucinations.  All other systems reviewed and are negative.  Physical Exam: Blood pressure 132/70, pulse 77, temperature 98.3 F (36.8 C), temperature source Oral, height 5\' 10"  (1.778 m), weight (!) 365 lb 9.6 oz (165.8 kg), SpO2 96 %. Gen:      No acute distress HEENT:  EOMI, sclera anicteric Neck:     No masses; no thyromegaly Lungs:    Clear to auscultation bilaterally; normal respiratory effort CV:         Regular rate and rhythm; no murmurs Abd:      + bowel sounds; soft, non-tender; no palpable masses, no distension Ext:    No edema; adequate peripheral perfusion Skin:      Warm and dry; no rash Neuro: alert and oriented x 3 Psych: normal mood and affect  Data Reviewed: Imaging: High-res CT 12/12/2017-pulmonary artery dilatation, mosaic attenuation secondary to pulmonary vascular disease.  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  Labs: CBC 08/30/2017 WBC 8.4, eos 4%, absolute eosinophil count 336  Cardiac: Echocardiogram 07/15/2017 LVEF 60 to 65%, moderate concentric LVH Moderate pulmonary hypertension, RV systolic pressure 59  Sleep: PSG 09/24/2017-AHI 20.3, O2 nadir 73% CPAP titration study 12/15/2017-recommend CPAP of 17 cm  Assessment:  Chronic hypoxic respiratory failure 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 Will repeat high-res CT and PFTs for evaluation  Chronic diastolic heart failure Will need to reestablish with cardiology. Refer to Dr. Agustin Cree  Plan/Recommendations: High-res CT, PFTs Follow-up on sleep study Refer to cardiology  Marshell Garfinkel MD [facility] 03/13/2021, 4:09 PM  CC: No ref. provider found

## 2021-03-13 NOTE — Patient Instructions (Signed)
We will schedule you for high-resolution CT and PFTs for reassessment of the lungs Follow-up on the results of the sleep study because I think you need to be initiated back on CPAP I will refer you back to Dr. Bing Matter cardiology for reestablishing care with him  Follow-up in 3 months

## 2021-04-29 ENCOUNTER — Other Ambulatory Visit: Payer: Self-pay

## 2021-04-29 ENCOUNTER — Encounter: Payer: Self-pay | Admitting: Pulmonary Disease

## 2021-04-29 ENCOUNTER — Ambulatory Visit (INDEPENDENT_AMBULATORY_CARE_PROVIDER_SITE_OTHER): Payer: Medicare HMO | Admitting: Pulmonary Disease

## 2021-04-29 ENCOUNTER — Ambulatory Visit: Payer: Medicare HMO

## 2021-04-29 VITALS — BP 136/78 | HR 76 | Ht 69.0 in | Wt 363.0 lb

## 2021-04-29 DIAGNOSIS — R0602 Shortness of breath: Secondary | ICD-10-CM

## 2021-04-29 DIAGNOSIS — I272 Pulmonary hypertension, unspecified: Secondary | ICD-10-CM

## 2021-04-29 LAB — PULMONARY FUNCTION TEST
DL/VA % pred: 97 %
DL/VA: 4.03 ml/min/mmHg/L
DLCO cor % pred: 70 %
DLCO cor: 17.29 ml/min/mmHg
DLCO unc % pred: 70 %
DLCO unc: 17.29 ml/min/mmHg
FEF 25-75 Post: 2.14 L/sec
FEF 25-75 Pre: 2.03 L/sec
FEF2575-%Change-Post: 5 %
FEF2575-%Pred-Post: 73 %
FEF2575-%Pred-Pre: 70 %
FEV1-%Change-Post: 0 %
FEV1-%Pred-Post: 60 %
FEV1-%Pred-Pre: 61 %
FEV1-Post: 1.95 L
FEV1-Pre: 1.96 L
FEV1FVC-%Change-Post: -6 %
FEV1FVC-%Pred-Pre: 103 %
FEV6-%Change-Post: 4 %
FEV6-%Pred-Post: 62 %
FEV6-%Pred-Pre: 59 %
FEV6-Post: 2.47 L
FEV6-Pre: 2.38 L
FEV6FVC-%Change-Post: -1 %
FEV6FVC-%Pred-Post: 101 %
FEV6FVC-%Pred-Pre: 103 %
FVC-%Change-Post: 5 %
FVC-%Pred-Post: 61 %
FVC-%Pred-Pre: 58 %
FVC-Post: 2.52 L
FVC-Pre: 2.38 L
Post FEV1/FVC ratio: 77 %
Post FEV6/FVC ratio: 98 %
Pre FEV1/FVC ratio: 82 %
Pre FEV6/FVC Ratio: 100 %
RV % pred: 112 %
RV: 2.39 L
TLC % pred: 83 %
TLC: 4.84 L

## 2021-04-29 NOTE — Patient Instructions (Signed)
I am glad you are stable with regard to breathing ?We will get in touch with [facility] your primary care physician to get results of the sleep study ?We will order a VQ scan for evaluation of blood clots in the lung.  We will check if this can be done at [facility] ?Follow-up in 3 months ?

## 2021-04-29 NOTE — Progress Notes (Addendum)
? ?      ?Emily Mooney    [id number]    March 02, 1965 ? ?Primary Care Physician:No primary care provider on file. ? ?Referring Physician: No referring provider defined for this encounter. ? ?Chief complaint:   ?Follow-up for chronic respiratory failure with hypoxia, obstructive sleep apnea, pulmonary hypertension ? ?HPI: ?56 year old with history of chronic respiratory failure, hypoxia, obstructive sleep apnea, pulmonary hypertension, pseudotumor cerebri status post VP shunt ?She was previously followed by Dr. 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  She has not followed up in clinic since 2020 ? ?She has been noncompliant with her medication due to change in insurance and providers.  Per patient she had a couple of hospitalizations at [facility] for heart failure requiring Lasix therapy.  She was also off oxygen for a while and had to restart ?She is on CPAP for OSA but stopped in January 2020 when she had a VP shunt placed as it was interfering with the incision healing but never got restarted on it ? ?She has followed up with her primary care who has reordered the sleep study which was done last week.  Results are still pending ? ?Pets: No pets ?Occupation: Retired Forensic scientist ?Exposures: No mold, hot tub, Jacuzzi.  No feather pillows or comforters ?Smoking history: 36-pack-year smoking history.  Quit at age 61 ?Travel history: No significant travel history ?Relevant family history: No family history of lung disease ? ?Interim history: ?She had PFTs and is here for review.  Finished sleep study which was ordered by primary care in February but has not received the results yet ?Is due to see cardiology in the next few weeks. ? ?Outpatient Encounter Medications as of 04/29/2021  ?Medication Sig  ? acetaZOLAMIDE (DIAMOX) 500 MG capsule Take 1 capsule (500 mg total) by mouth 2 (two) times daily.  ? butalbital-acetaminophen-caffeine (FIORICET, ESGIC) 50-325-40 MG tablet   ? carvedilol  (COREG) 3.125 MG tablet Take 3.125 mg by mouth 2 (two) times daily.  ? dapagliflozin propanediol (FARXIGA) 10 MG TABS tablet Take 10 mg by mouth daily.  ? fenofibrate (TRICOR) 145 MG tablet Take 145 mg by mouth daily.  ? furosemide (LASIX) 40 MG tablet Take 40 mg by mouth daily. May increase to 60mg  if weight up 3 pounds  ? glipiZIDE (GLUCOTROL) 10 MG tablet Take 10 mg by mouth daily before breakfast.  ? glucose blood (ACCU-CHEK AVIVA PLUS) test strip   ? lisinopril (PRINIVIL,ZESTRIL) 5 MG tablet Take 5 mg by mouth daily.  ? metFORMIN (GLUCOPHAGE) 500 MG tablet Take 500 mg by mouth daily.  ? rosuvastatin (CRESTOR) 20 MG tablet Take 1 tablet by mouth once daily  ? sitaGLIPtin-metformin (JANUMET) 50-1000 MG tablet Take 1 tablet by mouth daily.  ? ?No facility-administered encounter medications on file as of 04/29/2021.  ? ?Physical Exam: ?Blood pressure 136/78, pulse 76, height 5\' 9"  (1.753 m), weight (!) 363 lb (164.7 kg), SpO2 92 %. ?Gen:      No acute distress ?HEENT:  EOMI, sclera anicteric ?Neck:     No masses; no thyromegaly ?Lungs:    Clear to auscultation bilaterally; normal respiratory effort ?CV:         Regular rate and rhythm; no murmurs ?Abd:      + bowel sounds; soft, non-tender; no palpable masses, no distension ?Ext:    No edema; adequate peripheral perfusion ?Skin:      Warm and dry; no rash ?Neuro: alert and oriented x 3 ?Psych: normal  mood and affect  ? ?Data Reviewed: ?Imaging: ?High-res CT 12/12/2017-pulmonary artery dilatation, mosaic attenuation secondary to pulmonary vascular disease.  No evidence of interstitial lung disease, mild mediastinal reactive adenopathy ?Chest x-ray 02/04/2021-no active cardiopulmonary disease ?High-res CT 03/25/2021-subtle mosaic attenuation with inspiratory and expiratory cuts.  No interstitial lung disease ?I have reviewed the images personally ? 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 ?04/29/2021 ?FVC 2.52 [61%], FEV1 1.95 [60%], TLC 4.84 [83%], DLCO 17.29 [70%] ?Mild diffusion defect ? ?Labs: ?CBC 08/30/2017 ?WBC 8.4, eos 4%, absolute eosinophil count 336 ? ?Cardiac: ?Echocardiogram 07/15/2017 ?LVEF 60 to 65%, moderate concentric LVH ?Moderate pulmonary hypertension, RV systolic pressure 59 ? ?Sleep: ?PSG 09/24/2017-AHI 20.3, O2 nadir 73% ?CPAP titration study 12/15/2017-recommend CPAP of 17 cm ? 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  She had a sleep study done with her primary cannot get these records for review ? ?There is no significant evidence of interstitial lung disease and high-res CT done at [facility] reviewed with mosaicism which is consistent with pulmonary vascular disease ?Order VQ scan to rule out chronic thromboembolic disease ? ? ?Chronic diastolic heart failure ?She is due to see Dr. Townsend Roger and will need a repeat echocardiogram for reassessment of pulmonary hypertension ? ?Plan/Recommendations: ?Order VQ scan ?Follow-up with cardiology, echocardiogram ?Get sleep study results from primary ? ?Chilton Greathouse MD ?[facility] ?04/29/2021, 12:17 PM ? ?CC: No ref. provider found ? ?Addendum: ?VQ scan dated 04/30/2021 from [facility] ?No evidence of ventilatory perfusion abnormality.  Low probability for PE ? ?Sleep study from primary care office dated 03/02/2021 ?Severe obstructive sleep apnea with severe oxygen desaturation.  AHI 68.6.  O2 sat was at or below 88% for 454 minutes [100% of the time) ?

## 2021-04-29 NOTE — Progress Notes (Signed)
PFT done today. 

## 2021-04-30 ENCOUNTER — Telehealth: Payer: Self-pay | Admitting: Pulmonary Disease

## 2021-04-30 NOTE — Telephone Encounter (Signed)
VQ and CXR order faxed to [facility]. Received successful fax confirmation.  ?Radiology is aware. ?Nothing further needed.  ? ?

## 2021-04-30 NOTE — Telephone Encounter (Signed)
Fax to [phone] pt is there now for test.[facility] ? ?

## 2021-05-01 ENCOUNTER — Ambulatory Visit (INDEPENDENT_AMBULATORY_CARE_PROVIDER_SITE_OTHER): Payer: Medicare HMO | Admitting: Cardiology

## 2021-05-01 ENCOUNTER — Other Ambulatory Visit: Payer: Self-pay

## 2021-05-01 ENCOUNTER — Encounter: Payer: Self-pay | Admitting: Cardiology

## 2021-05-01 VITALS — BP 104/62 | HR 82 | Ht 69.0 in | Wt 368.2 lb

## 2021-05-01 DIAGNOSIS — E119 Type 2 diabetes mellitus without complications: Secondary | ICD-10-CM

## 2021-05-01 DIAGNOSIS — I5033 Acute on chronic diastolic (congestive) heart failure: Secondary | ICD-10-CM | POA: Diagnosis not present

## 2021-05-01 DIAGNOSIS — G4733 Obstructive sleep apnea (adult) (pediatric): Secondary | ICD-10-CM

## 2021-05-01 DIAGNOSIS — G932 Benign intracranial hypertension: Secondary | ICD-10-CM | POA: Diagnosis not present

## 2021-05-01 DIAGNOSIS — I272 Pulmonary hypertension, unspecified: Secondary | ICD-10-CM

## 2021-05-01 NOTE — Patient Instructions (Addendum)
Medication Instructions:  ?Your physician recommends that you continue on your current medications as directed. Please refer to the Current Medication list given to you today. ? ?*If you need a refill on your cardiac medications before your next appointment, please call your pharmacy* ? ? ?Lab Work: ?Your physician recommends that you return for lab work in:  ? ?Labs today: Pro BNP, Direct LDL, BMP ? ?If you have labs (blood work) drawn today and your tests are completely normal, you will receive your results only by: ?MyChart Message (if you have MyChart) OR ?A paper copy in the mail ?If you have any lab test that is abnormal or we need to change your treatment, we will call you to review the results. ? ? ?Testing/Procedures: ?Your physician has requested that you have an echocardiogram. Echocardiography is a painless test that uses sound waves to create images of your heart. It provides your doctor with information about the size and shape of your heart and how well your heart?s chambers and valves are working. This procedure takes approximately one hour. There are no restrictions for this procedure. ? ? ? ?Follow-Up: ?At [facility], you and your health needs are our priority.  As part of our continuing mission to provide you with exceptional heart care, we have created designated Provider Care Teams.  These Care Teams include your primary Cardiologist (physician) and Advanced Practice Providers (APPs -  Physician Assistants and Nurse Practitioners) who all work together to provide you with the care you need, when you need it. ? ?We recommend signing up for the patient portal called "MyChart".  Sign up information is provided on this After Visit Summary.  MyChart is used to connect with patients for Virtual Visits (Telemedicine).  Patients are able to view lab/test results, encounter notes, upcoming appointments, etc.  Non-urgent messages can be sent to your provider as well.   ?To learn more about what you  can do with MyChart, go to [email or web address].   ? ?Your next appointment:   ?6 month(s) ? ?The format for your next appointment:   ?In Person ? ?Provider:   ?Gypsy Balsam, MD  ? ? ?Other Instructions ?None ? ?

## 2021-05-01 NOTE — Progress Notes (Signed)
? ?Cardiology Consultation:   ? ?Date:  05/01/2021  ? ?ID:  Emily Mooney, DOB 12-13-1965, MRN [id number] ? ?PCP:  Buckner Malta, PA  ?Cardiologist:  Gypsy Balsam, MD  ? ?Referring MD: Chilton Greathouse, MD  ? ?Chief Complaint  ?Patient presents with  ? Edema  ? Shortness of Breath  ?  Ongoing for months  ? ? ?History of Present Illness:   ? ?Emily Mooney is a 56 y.o. female who is being seen today for the evaluation of pulmonary hypertension, shortness of breath.  At the request of Emily Mooney, Colbert Coyer, MD. past medical history significant for morbid obesity, diabetes mellitus, diastolic congestive heart failure, obstructive sleep apnea, pseudotumor cerebri status post VP shunt.  I did see her in 2019 then she disappeared from follow-up.  Recently being seen by pulmonary for pulmonary hypertension she was referred back to [place].  She said since December she has been getting short of breath.  She said when she is sitting still she is fine but when an effort will bring shortness of breath.  There is some swelling of lower extremities but seems to be quite well controlled.  She denies have any chest pain tightness squeezing pressure burning chest no palpitations no dizziness. ? ?Past Medical History:  ?Diagnosis Date  ? CHF (congestive heart failure) (HCC)   ? Diabetes mellitus without complication (HCC)   ? Headache   ? IIH (idiopathic intracranial hypertension)   ? Numbness in feet   ? ? ?Past Surgical History:  ?Procedure Laterality Date  ? TOOTH EXTRACTION    ? TUBAL LIGATION    ? ? ?Current Medications: ?Current Meds  ?Medication Sig  ? acetaZOLAMIDE (DIAMOX) 500 MG capsule Take 1 capsule (500 mg total) by mouth 2 (two) times daily.  ? butalbital-acetaminophen-caffeine (FIORICET, ESGIC) 50-325-40 MG tablet Take 1 tablet by mouth every 6 (six) hours as needed for headache or migraine.  ? carvedilol (COREG) 3.125 MG tablet Take 3.125 mg by mouth 2 (two) times daily.  ? dapagliflozin propanediol (FARXIGA) 10 MG TABS tablet  Take 10 mg by mouth daily.  ? fenofibrate (TRICOR) 145 MG tablet Take 145 mg by mouth daily.  ? furosemide (LASIX) 40 MG tablet Take 40 mg by mouth daily. May increase to 60mg  if weight up 3 pounds  ? glipiZIDE (GLUCOTROL) 10 MG tablet Take 10 mg by mouth daily before breakfast.  ? glucose blood (ACCU-CHEK AVIVA PLUS) test strip 1 each by Other route as needed for other (Glucose).  ? lisinopril (PRINIVIL,ZESTRIL) 5 MG tablet Take 5 mg by mouth daily.  ? metFORMIN (GLUCOPHAGE) 500 MG tablet Take 500 mg by mouth daily.  ? OXYGEN Inhale 3 L into the lungs as needed (Active and sleep).  ? rosuvastatin (CRESTOR) 20 MG tablet Take 1 tablet by mouth once daily (Patient taking differently: Take 20 mg by mouth daily.)  ? sitaGLIPtin-metformin (JANUMET) 50-1000 MG tablet Take 1 tablet by mouth daily.  ?  ? ?Allergies:   Depo-medrol [methylprednisolone]  ? ?Social History  ? ?Socioeconomic History  ? Marital status: Single  ?  Spouse name: Not on file  ? Number of children: 2  ? Years of education: Not on file  ? Highest education level: Not on file  ?Occupational History  ? Not on file  ?Tobacco Use  ? Smoking status: Former  ?  Packs/day: 2.00  ?  Types: Cigarettes  ?  Quit date: 04/12/2004  ?  Years since quitting: 17.0  ? Smokeless tobacco: Never  ?Substance  and Sexual Activity  ? Alcohol use: Not Currently  ? Drug use: Never  ? Sexual activity: Not Currently  ?Other Topics Concern  ? Not on file  ?Social History Narrative  ? Lives alone  ? Caffeine- soda a day  ? ?Social Determinants of Health  ? ?Financial Resource Strain: Not on file  ?Food Insecurity: Not on file  ?Transportation Needs: Not on file  ?Physical Activity: Not on file  ?Stress: Not on file  ?Social Connections: Not on file  ?  ? ?Family History: ?The patient's 612family history includes Cancer in her father. ?ROS:   ?Please see the history of present illness.    ?All 14 point review of systems negative except as described per history of present  illness. ? ?EKGs/Labs/Other Studies Reviewed:   ? ?The following studies were reviewed today: ?I did review echocardiogram from 2019 which showed preserved left ventricle ejection fraction, diastolic dysfunction, biatrial enlargement, pulmonary pressure in diameter of 59 mmHg. ? ?EKG:  EKG is  ordered today.  The ekg ordered today demonstrates sinus rhythm with occasional APCs, incomplete right bundle branch block possible right ventricle hypertrophy, ? ?Recent Labs: ?No results found for requested labs within last 8760 hours.  ?Recent Lipid Panel ?No results found for: CHOL, TRIG, HDL, CHOLHDL, VLDL, LDLCALC, LDLDIRECT ? ?Physical Exam:   ? ?VS:  BP 104/62 (BP Location: Left Arm, Patient Position: Sitting)   Pulse 82   Ht 5\' 9"  (1.753 m)   Wt (!) 368 lb 3.2 oz (167 kg)   SpO2 92%   BMI 54.37 kg/m?    ? ?Wt Readings from Last 3 Encounters:  ?05/01/21 (!) 368 lb 3.2 oz (167 kg)  ?04/29/21 (!) 363 lb (164.7 kg)  ?03/13/21 (!) 365 lb 9.6 oz (165.8 kg)  ?  ? ?GEN:  Well nourished, well developed in no acute distress, morbidly obese, ?HEENT: Normal ?NECK: No JVD; No carotid bruits ?LYMPHATICS: No lymphadenopathy ?CARDIAC: RRR, no murmurs, no rubs, no gallops ?RESPIRATORY:  Clear to auscultation without rales, wheezing or rhonchi, poor air entry bilaterally ?ABDOMEN: Soft, non-tender, non-distended ?MUSCULOSKELETAL:  No edema; No deformity  ?SKIN: Warm and dry ?NEUROLOGIC:  Alert and oriented x 3 ?PSYCHIATRIC:  Normal affect  ? ?ASSESSMENT:   ? ?1. Pulmonary HTN (HCC)   ?2. Acute on chronic diastolic congestive heart failure (HCC)   ?3. Benign intracranial hypertension   ?4. Morbid obesity (HCC)   ?5. Type 2 diabetes mellitus without complication, without long-term current use of insulin (HCC)   ?6. Obstructive sleep apnea   ? ?PLAN:   ? ?In order of problems listed above: ? ?Pulmonary hypertension.  It is a very complex situation she may have group 2 pulmonary hypertension secondary to left heart disease because of  diastolic dysfunction and for that I will ask her to have an echocardiogram done.  In the future we may consider doing cardiac catheterization left and right try to determine exactly Gardiner RamusMichael Scott from pulmonary hypertension and possibility of using some specific pulmonary hypertension medication.  It can be group 3 as well secondary to the chronic lung disease as she does have quite significant obstructive sleep apnea which is contributing to this.  And for that problem she is waiting for CPAP mask hopefully she will get it soon.  There is also remote possibility of group 4 with thrombi embolic events however she is already scheduled to have VQ scan hopefully that will answer that question.  For today I will ask her to have  an echocardiogram done, I will schedule her to have proBNP, Chem-7.  I see her back in my office in about 6 weeks. ?Diastolic congestive heart failure appears to be compensated but proBNP will be checked since she is morbidly obese and difficult to assess her hydration status. ?Morbid obesity there is no doubt in my mind that significant weight loss would help significantly.  She will could be a candidate for gastric bypass surgery but because of her pulmonary hypertension I cannot of course much higher risk.  Will recheck echocardiogram we will consider cardiac catheterization left and right to assess pulmonary pressure ?Type 2 diabetes followed by internal medicine team.  Her last hemoglobin A1c from December 28 was 9.0.  Obviously that need to be better controlled. ?Dyslipidemia I did review her K PN which show me her LDL of 128 HDL 61.  She has been put since that time on Crestor 20 which is an excellent choice high intense statin.  I will check his direct LDL today.   ? ? ?Medication Adjustments/Labs and Tests Ordered: ?Current medicines are reviewed at length with the patient today.  Concerns regarding medicines are outlined above.  ?No orders of the defined types were placed in this  encounter. ? ?No orders of the defined types were placed in this encounter. ? ? ?Signed, ?Georgeanna Lea, MD, [facility]. ?05/01/2021 10:09 AM    ?[facility] ?

## 2021-05-06 ENCOUNTER — Ambulatory Visit (INDEPENDENT_AMBULATORY_CARE_PROVIDER_SITE_OTHER): Payer: Medicare HMO

## 2021-05-06 DIAGNOSIS — I272 Pulmonary hypertension, unspecified: Secondary | ICD-10-CM

## 2021-05-06 DIAGNOSIS — G932 Benign intracranial hypertension: Secondary | ICD-10-CM

## 2021-05-06 DIAGNOSIS — I5033 Acute on chronic diastolic (congestive) heart failure: Secondary | ICD-10-CM

## 2021-05-06 DIAGNOSIS — G4733 Obstructive sleep apnea (adult) (pediatric): Secondary | ICD-10-CM

## 2021-05-06 DIAGNOSIS — E119 Type 2 diabetes mellitus without complications: Secondary | ICD-10-CM

## 2021-05-06 LAB — ECHOCARDIOGRAM COMPLETE
Area-P 1/2: 3.65 cm2
S' Lateral: 2.6 cm

## 2021-05-07 ENCOUNTER — Other Ambulatory Visit: Payer: Medicare HMO

## 2021-05-08 ENCOUNTER — Other Ambulatory Visit: Payer: Self-pay | Admitting: *Deleted

## 2021-05-08 ENCOUNTER — Other Ambulatory Visit: Payer: Self-pay | Admitting: Cardiology

## 2021-05-08 DIAGNOSIS — I272 Pulmonary hypertension, unspecified: Secondary | ICD-10-CM

## 2021-05-08 DIAGNOSIS — G932 Benign intracranial hypertension: Secondary | ICD-10-CM

## 2021-05-08 DIAGNOSIS — I5033 Acute on chronic diastolic (congestive) heart failure: Secondary | ICD-10-CM

## 2021-05-08 DIAGNOSIS — E119 Type 2 diabetes mellitus without complications: Secondary | ICD-10-CM

## 2021-05-09 LAB — BASIC METABOLIC PANEL
BUN/Creatinine Ratio: 17 (ref 9–23)
BUN: 25 mg/dL — ABNORMAL HIGH (ref 6–24)
CO2: 30 mmol/L — ABNORMAL HIGH (ref 20–29)
Calcium: 9.7 mg/dL (ref 8.7–10.2)
Chloride: 95 mmol/L — ABNORMAL LOW (ref 96–106)
Creatinine, Ser: 1.43 mg/dL — ABNORMAL HIGH (ref 0.57–1.00)
Glucose: 267 mg/dL — ABNORMAL HIGH (ref 70–99)
Potassium: 4.5 mmol/L (ref 3.5–5.2)
Sodium: 137 mmol/L (ref 134–144)
eGFR: 44 mL/min/{1.73_m2} — ABNORMAL LOW (ref >59)

## 2021-05-09 LAB — PRO B NATRIURETIC PEPTIDE
NT-Pro BNP: 47 pg/mL (ref 0–249)
NT-Pro BNP: 140 pg/mL (ref 0–287)

## 2021-05-09 LAB — LDL CHOLESTEROL, DIRECT: LDL Direct: 79 mg/dL (ref 0–99)

## 2021-05-20 DIAGNOSIS — E1169 Type 2 diabetes mellitus with other specified complication: Secondary | ICD-10-CM | POA: Diagnosis not present

## 2021-05-20 DIAGNOSIS — I272 Pulmonary hypertension, unspecified: Secondary | ICD-10-CM | POA: Diagnosis not present

## 2021-05-28 DIAGNOSIS — E1159 Type 2 diabetes mellitus with other circulatory complications: Secondary | ICD-10-CM | POA: Diagnosis not present

## 2021-05-28 DIAGNOSIS — E785 Hyperlipidemia, unspecified: Secondary | ICD-10-CM | POA: Diagnosis not present

## 2021-05-28 DIAGNOSIS — E1169 Type 2 diabetes mellitus with other specified complication: Secondary | ICD-10-CM | POA: Diagnosis not present

## 2021-05-28 DIAGNOSIS — I152 Hypertension secondary to endocrine disorders: Secondary | ICD-10-CM | POA: Diagnosis not present

## 2021-06-16 ENCOUNTER — Ambulatory Visit: Payer: Medicare HMO | Admitting: Pulmonary Disease

## 2021-07-03 DIAGNOSIS — G932 Benign intracranial hypertension: Secondary | ICD-10-CM | POA: Diagnosis not present

## 2021-08-04 ENCOUNTER — Encounter: Payer: Self-pay | Admitting: Pulmonary Disease

## 2021-08-04 ENCOUNTER — Ambulatory Visit (INDEPENDENT_AMBULATORY_CARE_PROVIDER_SITE_OTHER): Payer: Medicare HMO | Admitting: Pulmonary Disease

## 2021-08-04 VITALS — BP 130/78 | HR 76 | Temp 98.4°F | Ht 69.0 in | Wt 358.8 lb

## 2021-08-04 DIAGNOSIS — G4733 Obstructive sleep apnea (adult) (pediatric): Secondary | ICD-10-CM

## 2021-08-04 NOTE — Progress Notes (Signed)
Emily Mooney    [id number]    10-10-1965  Primary Care Physician:Webster, Morrie Sheldon, [place]  Referring Physician: Buckner Malta, PA [facility] [address] [place],  [place] [address]  Chief complaint:   Follow-up for chronic respiratory failure with hypoxia, obstructive sleep apnea, pulmonary hypertension  HPI: 56 year old with history of chronic respiratory failure, hypoxia, obstructive sleep apnea, pulmonary hypertension, pseudotumor cerebri status post VP shunt She was previously followed by Dr. 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  She has not followed up in clinic since 2020  She has been noncompliant with her medication due to change in insurance and providers.  Per patient she had a couple of hospitalizations at [facility] for heart failure requiring Lasix therapy.  She was also off oxygen for a while and had to restart She is on CPAP for OSA but stopped in January 2020 when she had a VP shunt placed as it was interfering with the incision healing but never got restarted on it  She has followed up with her primary care who has reordered the sleep study which was done last week.  Results are still pending  Pets: No pets Occupation: Retired Forensic scientist Exposures: No mold, hot tub, Jacuzzi.  No feather pillows or comforters Smoking history: 36-pack-year smoking history.  Quit at age 50 Travel history: No significant travel history Relevant family history: No family history of lung disease  Interim history: She had a VQ scan done at [place] with low probability for PE. Sleep study results from primary care reviewed with severe sleep apnea.  CPAP has been ordered by her primary care but she has not received it yet due to insurance reasons.  Outpatient Encounter Medications as of 08/04/2021  Medication Sig   acetaZOLAMIDE (DIAMOX) 500 MG capsule Take 1 capsule (500 mg total) by mouth 2 (two) times daily.    butalbital-acetaminophen-caffeine (FIORICET, ESGIC) 50-325-40 MG tablet Take 1 tablet by mouth every 6 (six) hours as needed for headache or migraine.   carvedilol (COREG) 3.125 MG tablet Take 3.125 mg by mouth 2 (two) times daily.   dapagliflozin propanediol (FARXIGA) 10 MG TABS tablet Take 10 mg by mouth daily.   fenofibrate micronized (LOFIBRA) 200 MG capsule Take 200 mg by mouth daily.   furosemide (LASIX) 40 MG tablet Take 40 mg by mouth daily. May increase to 60mg  if weight up 3 pounds   glipiZIDE (GLUCOTROL) 10 MG tablet Take 10 mg by mouth daily before breakfast.   glucose blood (ACCU-CHEK AVIVA PLUS) test strip 1 each by Other route as needed for other (Glucose).   lisinopril (PRINIVIL,ZESTRIL) 5 MG tablet Take 5 mg by mouth daily.   metFORMIN (GLUCOPHAGE) 500 MG tablet Take 500 mg by mouth daily.   OXYGEN Inhale 3 L into the lungs as needed (Active and sleep).   rosuvastatin (CRESTOR) 20 MG tablet Take 1 tablet by mouth once daily (Patient taking differently: Take 20 mg by mouth daily.)   sitaGLIPtin-metformin (JANUMET) 50-1000 MG tablet Take 1 tablet by mouth daily.   [DISCONTINUED] fenofibrate (TRICOR) 145 MG tablet Take 145 mg by mouth daily.   No facility-administered encounter medications on file as of 08/04/2021.   Physical Exam: Gen:      No acute distress HEENT:  EOMI, sclera anicteric Neck:     No masses; no thyromegaly Lungs:    Clear to auscultation bilaterally; normal respiratory effort CV:  Regular rate and rhythm; no murmurs Abd:      + bowel sounds; soft, non-tender; no palpable masses, no distension Ext:    No edema; adequate peripheral perfusion Skin:      Warm and dry; no rash Neuro: alert and oriented x 3 Psych: normal mood and affect   Data Reviewed: Imaging: High-res CT 12/12/2017-pulmonary artery dilatation, mosaic attenuation secondary to pulmonary vascular disease.  No evidence of interstitial lung disease, mild mediastinal reactive  adenopathy Chest x-ray 02/04/2021-no active cardiopulmonary disease High-res CT 03/25/2021-subtle mosaic attenuation with inspiratory and expiratory cuts.  No interstitial lung disease  VQ scan dated 04/30/2021 from [facility] No evidence of ventilatory perfusion abnormality.  Low probability for PE  I have reviewed the images personally  PFTs: 11/11/2017 FVC 2.80 [10%], FEV1 2.30 [70%], F/F 82, TLC 4.57 [78%], DLCO 18.44 [59%] Mild restrictive lung disease with severely reduced diffusion capacity  04/29/2021 FVC 2.52 [61%], FEV1 1.95 [60%], TLC 4.84 [83%], DLCO 17.29 [70%] Mild diffusion defect  Labs: CBC 08/30/2017 WBC 8.4, eos 4%, absolute eosinophil count 336  Cardiac: Echocardiogram 07/15/2017 LVEF 60 to 65%, moderate concentric LVH Moderate pulmonary hypertension, RV systolic pressure 59  Echocardiogram 05/06/2021 LVEF 60-65%, grade 2 diastolic dysfunction, normal RV systolic size and function.  No TR noted.  Sleep: PSG 09/24/2017-AHI 20.3, O2 nadir 73% CPAP titration study 12/15/2017-recommend CPAP of 17 cm  Sleep study from primary care office dated 03/02/2021 Severe obstructive sleep apnea with severe oxygen desaturation.  AHI 68.6.  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  Based on severity of OSA and desaturation she will need CPAP titration study for optimal treatment  There is no significant evidence of interstitial lung disease and high-res CT done at [facility] reviewed with mosaicism which is consistent with pulmonary vascular disease however repeat echocardiogram shows normal RV size and function with no TR.  There is no evidence of pulm embolism on VQ scan  Chronic diastolic heart failure Follows  with Dr. Townsend Roger  Plan/Recommendations: CPAP titration study Continue supplemental oxygen.  Chilton Greathouse MD [facility] 08/04/2021, 10:18 AM  CC: Buckner Malta, PA  Addendum:

## 2021-08-04 NOTE — Patient Instructions (Signed)
We will order CPAP titration study for evaluation of severe sleep apnea Continue supplemental oxygen Follow-up in 3 to 4 months.

## 2021-08-20 DIAGNOSIS — E1169 Type 2 diabetes mellitus with other specified complication: Secondary | ICD-10-CM | POA: Diagnosis not present

## 2021-08-27 DIAGNOSIS — E785 Hyperlipidemia, unspecified: Secondary | ICD-10-CM | POA: Diagnosis not present

## 2021-08-27 DIAGNOSIS — N1831 Chronic kidney disease, stage 3a: Secondary | ICD-10-CM | POA: Diagnosis not present

## 2021-08-27 DIAGNOSIS — E1140 Type 2 diabetes mellitus with diabetic neuropathy, unspecified: Secondary | ICD-10-CM | POA: Diagnosis not present

## 2021-08-27 DIAGNOSIS — Z6841 Body Mass Index (BMI) 40.0 and over, adult: Secondary | ICD-10-CM | POA: Diagnosis not present

## 2021-08-27 DIAGNOSIS — E1169 Type 2 diabetes mellitus with other specified complication: Secondary | ICD-10-CM | POA: Diagnosis not present

## 2021-08-27 DIAGNOSIS — Z789 Other specified health status: Secondary | ICD-10-CM | POA: Diagnosis not present

## 2021-09-15 DIAGNOSIS — J9610 Chronic respiratory failure, unspecified whether with hypoxia or hypercapnia: Secondary | ICD-10-CM | POA: Diagnosis not present

## 2021-09-15 DIAGNOSIS — E1169 Type 2 diabetes mellitus with other specified complication: Secondary | ICD-10-CM | POA: Diagnosis not present

## 2021-09-28 ENCOUNTER — Ambulatory Visit (HOSPITAL_BASED_OUTPATIENT_CLINIC_OR_DEPARTMENT_OTHER): Payer: Medicare HMO | Attending: Pulmonary Disease | Admitting: Pulmonary Disease

## 2021-09-28 DIAGNOSIS — G4733 Obstructive sleep apnea (adult) (pediatric): Secondary | ICD-10-CM | POA: Insufficient documentation

## 2021-10-15 ENCOUNTER — Telehealth: Payer: Self-pay | Admitting: Pulmonary Disease

## 2021-10-15 NOTE — Procedures (Signed)
Patient Name: Emily Mooney, [place] Date: 09/28/2021 Gender: Female D.O.B: May 19, 1965 Age (years): 89 Referring Provider: Nicki Guadalajara MD, ABSM Height (inches): 69 Interpreting Physician: Nicki Guadalajara MD, ABSM Weight (lbs): 351 RPSGT: Armen Pickup BMI: 52 MRN: [id number] Neck Size: 18.00 <br> <br> CLINICAL INFORMATION The patient is referred for a CPAP titration to treat sleep apnea.    Date of NPSG, Split Night or HST:  SLEEP STUDY TECHNIQUE As per the AASM Manual for the Scoring of Sleep and Associated Events v2.3 (April 2016) with a hypopnea requiring 4% desaturations.  The channels recorded and monitored were frontal, central and occipital EEG, electrooculogram (EOG), submentalis EMG (chin), nasal and oral airflow, thoracic and abdominal wall motion, anterior tibialis EMG, snore microphone, electrocardiogram, and pulse oximetry. Continuous positive airway pressure (CPAP) was initiated at the beginning of the study and titrated to treat sleep-disordered breathing.  MEDICATIONS Medications self-administered by patient taken the night of the study : TYLENOL  TECHNICIAN COMMENTS Comments added by technician: Pt had one rest room visted. O2 initiated due to low sats. Patient had difficulty initiating sleep. Patient was restless all through the night. Comments added by scorer: N/A RESPIRATORY PARAMETERS Optimal PAP Pressure (cm): 8 AHI at Optimal Pressure (/hr): 0 Overall Minimal O2 (%): 76.0 Supine % at Optimal Pressure (%): 100 Minimal O2 at Optimal Pressure (%): 77.0   SLEEP ARCHITECTURE The study was initiated at 10:37:44 PM and ended at 5:02:32 AM.  Sleep onset time was 50.7 minutes and the sleep efficiency was 31.3%%. The total sleep time was 120.5 minutes.  The patient spent 6.6%% of the night in stage N1 sleep, 62.2%% in stage N2 sleep, 23.7%% in stage N3 and 7.5% in REM.Stage REM latency was 275.5 minutes  Wake after sleep onset was 213.6. Alpha intrusion was absent.  Supine sleep was 100.00%.  CARDIAC DATA The 2 lead EKG demonstrated sinus rhythm. The mean heart rate was 66.7 beats per minute. Other EKG findings include: None. LEG MOVEMENT DATA The total Periodic Limb Movements of Sleep (PLMS) were 0. The PLMS index was 0.0. A PLMS index of <15 is considered normal in adults.  IMPRESSIONS - The optimal PAP pressure was 8 cm of water. - Severe oxygen desaturations were observed during this titration (min O2 = 76.0%). - No snoring was audible during this study. - No cardiac abnormalities were observed during this study. - Clinically significant periodic limb movements were not noted during this study. Arousals associated with PLMs were rare.  DIAGNOSIS - Obstructive Sleep Apnea (G47.33)  RECOMMENDATIONS - Trial of CPAP therapy on 8 cm H2O with a Petite size [employer] Full Face Amara mask and heated humidification with 2L oxygen piped in to system. - Avoid alcohol, sedatives and other CNS depressants that may worsen sleep apnea and disrupt normal sleep architecture. - Sleep hygiene should be reviewed to assess factors that may improve sleep quality. - Weight management and regular exercise should be initiated or continued. - Return to [facility] for re-evaluation after 4 weeks of therapy  [Electronically signed] 10/15/2021 05:50 AM  Virl Diamond MD NPI: [id number]

## 2021-10-15 NOTE — Telephone Encounter (Signed)
Call patient  Sleep study result  Date of study: 09/28/2021  Impression: Moderate obstructive sleep apnea  Recommendation: DME referral  Trial of CPAP therapy on 8 cm H2O with a Petite size [employer] Full Face Amara mask and heated humidification with 2L oxygen piped in to system.  Encourage weight loss measures  Follow-up in the office 4 to 6 weeks following initiation of treatment

## 2021-10-15 NOTE — Telephone Encounter (Signed)
I called and was unable to leave a message for results. Can try again another time.

## 2021-10-16 DIAGNOSIS — E1169 Type 2 diabetes mellitus with other specified complication: Secondary | ICD-10-CM | POA: Diagnosis not present

## 2021-10-16 DIAGNOSIS — J9610 Chronic respiratory failure, unspecified whether with hypoxia or hypercapnia: Secondary | ICD-10-CM | POA: Diagnosis not present

## 2021-10-29 NOTE — Telephone Encounter (Signed)
She has an office visit coming up and the provider can go over her results at the visit. Closing encounter.

## 2021-11-04 ENCOUNTER — Ambulatory Visit (INDEPENDENT_AMBULATORY_CARE_PROVIDER_SITE_OTHER): Payer: Medicare HMO | Admitting: Pulmonary Disease

## 2021-11-04 ENCOUNTER — Encounter: Payer: Self-pay | Admitting: Pulmonary Disease

## 2021-11-04 VITALS — BP 132/64 | HR 68 | Temp 98.3°F | Ht 69.0 in | Wt 358.0 lb

## 2021-11-04 DIAGNOSIS — I272 Pulmonary hypertension, unspecified: Secondary | ICD-10-CM

## 2021-11-04 DIAGNOSIS — G4733 Obstructive sleep apnea (adult) (pediatric): Secondary | ICD-10-CM

## 2021-11-04 NOTE — Patient Instructions (Signed)
We had reviewed those sleep test today.  Based on those results we will start you on CPAP at 8 cm of water and 2 L of oxygen. Continue oxygen during the time as well  Follow-up in 3 months.

## 2021-11-04 NOTE — Progress Notes (Unsigned)
Emily Mooney    [id number]    08/11/1965  Primary Care Physician:Webster, Caryl Pina, [place]  Referring Physician: Lawson Radar, [place]. [address] [place],  [place] [address]  Chief complaint:   Follow-up for chronic respiratory failure with hypoxia, obstructive sleep apnea, pulmonary hypertension  HPI: 56 year old with history of chronic respiratory failure, hypoxia, obstructive sleep apnea, pulmonary hypertension, pseudotumor cerebri status post VP shunt She was previously followed by Dr. 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  She has not followed up in clinic since 2020  She has been noncompliant with her medication due to change in insurance and providers.  Per patient she had a couple of hospitalizations at [facility] for heart failure requiring Lasix therapy.  She was also off oxygen for a while and had to restart She is on CPAP for OSA but stopped in January 2020 when she had a VP shunt placed as it was interfering with the incision healing but never got restarted on it  She has followed up with her primary care who has reordered the sleep study which was done last week.  Results are still pending  Pets: No pets Occupation: Retired Tourist information centre manager Exposures: No mold, hot tub, Jacuzzi.  No feather pillows or comforters Smoking history: 36-pack-year smoking history.  Quit at age 62 Travel history: No significant travel history Relevant family history: No family history of lung disease  Interim history: She had a VQ scan done at [place] with low probability for PE. Sleep study results from primary care reviewed with severe sleep apnea.  CPAP has been ordered by her primary care but she has not received it yet due to insurance reasons.  Outpatient Encounter Medications as of 11/04/2021  Medication Sig   acetaZOLAMIDE (DIAMOX) 500 MG capsule Take 1 capsule (500 mg total) by mouth 2 (two) times daily.    butalbital-acetaminophen-caffeine (FIORICET, ESGIC) 50-325-40 MG tablet Take 1 tablet by mouth every 6 (six) hours as needed for headache or migraine.   carvedilol (COREG) 3.125 MG tablet Take 3.125 mg by mouth 2 (two) times daily.   dapagliflozin propanediol (FARXIGA) 10 MG TABS tablet Take 10 mg by mouth daily.   fenofibrate micronized (LOFIBRA) 200 MG capsule Take 200 mg by mouth daily.   furosemide (LASIX) 40 MG tablet Take 40 mg by mouth daily. May increase to 60mg  if weight up 3 pounds   glipiZIDE (GLUCOTROL) 10 MG tablet Take 10 mg by mouth daily before breakfast.   glucose blood (ACCU-CHEK AVIVA PLUS) test strip 1 each by Other route as needed for other (Glucose).   lisinopril (PRINIVIL,ZESTRIL) 5 MG tablet Take 5 mg by mouth daily.   metFORMIN (GLUCOPHAGE) 500 MG tablet Take 500 mg by mouth daily.   OXYGEN Inhale 3 L into the lungs as needed (Active and sleep).   rosuvastatin (CRESTOR) 20 MG tablet Take 1 tablet by mouth once daily (Patient taking differently: Take 20 mg by mouth daily.)   sitaGLIPtin-metformin (JANUMET) 50-1000 MG tablet Take 1 tablet by mouth daily.   No facility-administered encounter medications on file as of 11/04/2021.   Physical Exam: Gen:      No acute distress HEENT:  EOMI, sclera anicteric Neck:     No masses; no thyromegaly Lungs:    Clear to auscultation bilaterally; normal respiratory effort CV:         Regular rate and rhythm; no murmurs Abd:      +  bowel sounds; soft, non-tender; no palpable masses, no distension Ext:    No edema; adequate peripheral perfusion Skin:      Warm and dry; no rash Neuro: alert and oriented x 3 Psych: normal mood and affect   Data Reviewed: Imaging: High-res CT 12/12/2017-pulmonary artery dilatation, mosaic attenuation secondary to pulmonary vascular disease.  No evidence of interstitial lung disease, mild mediastinal reactive adenopathy Chest x-ray 02/04/2021-no active cardiopulmonary disease High-res CT  03/25/2021-subtle mosaic attenuation with inspiratory and expiratory cuts.  No interstitial lung disease  VQ scan dated 04/30/2021 from [facility] No evidence of ventilatory perfusion abnormality.  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  No TR noted.  Sleep: PSG 09/24/2017-AHI 20.3, O2 nadir 73% CPAP titration study 12/15/2017-recommend CPAP of 17 cm  Sleep study from primary care office dated 03/02/2021 Severe obstructive sleep apnea with severe oxygen desaturation.  AHI 68.6.  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  There is no evidence of pulm embolism on VQ scan  Chronic diastolic heart failure Follows with Dr. Edyth Gunnels  Plan/Recommendations: Order CPAP and supplemental oxygen per titration study recommendation  Marshell Garfinkel MD [facility] 11/04/2021, 9:11 AM  CC: Lawson Radar, PA

## 2021-11-05 ENCOUNTER — Encounter: Payer: Self-pay | Admitting: Cardiology

## 2021-11-05 ENCOUNTER — Ambulatory Visit: Payer: Medicare HMO | Attending: Cardiology | Admitting: Cardiology

## 2021-11-05 VITALS — BP 119/77 | HR 74 | Ht 69.0 in | Wt 351.0 lb

## 2021-11-05 DIAGNOSIS — G4733 Obstructive sleep apnea (adult) (pediatric): Secondary | ICD-10-CM | POA: Diagnosis not present

## 2021-11-05 DIAGNOSIS — R0609 Other forms of dyspnea: Secondary | ICD-10-CM | POA: Diagnosis not present

## 2021-11-05 DIAGNOSIS — I272 Pulmonary hypertension, unspecified: Secondary | ICD-10-CM | POA: Diagnosis not present

## 2021-11-05 DIAGNOSIS — Z13220 Encounter for screening for lipoid disorders: Secondary | ICD-10-CM | POA: Diagnosis not present

## 2021-11-05 DIAGNOSIS — I5032 Chronic diastolic (congestive) heart failure: Secondary | ICD-10-CM | POA: Diagnosis not present

## 2021-11-05 DIAGNOSIS — E119 Type 2 diabetes mellitus without complications: Secondary | ICD-10-CM | POA: Diagnosis not present

## 2021-11-05 DIAGNOSIS — G932 Benign intracranial hypertension: Secondary | ICD-10-CM

## 2021-11-05 NOTE — Progress Notes (Signed)
Cardiology Office Note:    Date:  11/05/2021   ID:  Emily Mooney, DOB 10-16-1965, MRN [id number]  PCP:  Buckner Malta, PA  Cardiologist:  Gypsy Balsam, MD    Referring MD: Buckner Malta, [place]   Chief Complaint  Patient presents with   Follow-up  Doing fine  History of Present Illness:    Emily Mooney is a 56 y.o. female with past medical history significant for pulmonary hypertension, morbid obesity, essential hypertension, obstructive sleep apnea, diastolic dysfunction.  She was referred to [place] for evaluation of pulmonary hypertension.  Echocardiogram has been repeated which interestingly showed no tricuspid regurgitation, therefore, we could not assess pulmonary artery pressure.  However the fact that there is lack of significant tricuspid regurgitation indicates that at least at the time of the study pulmonary pressure was not significantly elevated.  She comes today 2 months for follow-up she wears oxygen all the time she says she is doing well.  She just came back from [facility] that she spent some time however she admits that she did not walk on the beach she was just sitting in the pool and [place].  Shortness of breath is the same.  There is no cough there is no swelling of lower extremities.  She is still awaiting CPAP mask.  Past Medical History:  Diagnosis Date   CHF (congestive heart failure) (HCC)    Diabetes mellitus without complication (HCC)    Headache    IIH (idiopathic intracranial hypertension)    Numbness in feet     Past Surgical History:  Procedure Laterality Date   TOOTH EXTRACTION     TUBAL LIGATION      Current Medications: Current Meds  Medication Sig   acetaZOLAMIDE (DIAMOX) 500 MG capsule Take 1 capsule (500 mg total) by mouth 2 (two) times daily.   butalbital-acetaminophen-caffeine (FIORICET, ESGIC) 50-325-40 MG tablet Take 1 tablet by mouth every 6 (six) hours as needed for headache or migraine.   carvedilol (COREG) 3.125 MG tablet  Take 3.125 mg by mouth 2 (two) times daily.   dapagliflozin propanediol (FARXIGA) 10 MG TABS tablet Take 10 mg by mouth daily.   fenofibrate micronized (LOFIBRA) 200 MG capsule Take 200 mg by mouth daily.   furosemide (LASIX) 40 MG tablet Take 40 mg by mouth daily. May increase to 60mg  if weight up 3 pounds   glipiZIDE (GLUCOTROL) 10 MG tablet Take 10 mg by mouth daily before breakfast.   glucose blood (ACCU-CHEK AVIVA PLUS) test strip 1 each by Other route as needed for other (Glucose).   lisinopril (PRINIVIL,ZESTRIL) 5 MG tablet Take 5 mg by mouth daily.   OXYGEN Inhale 3 L into the lungs as needed (Active and sleep).   rosuvastatin (CRESTOR) 20 MG tablet Take 1 tablet by mouth once daily   sitaGLIPtin-metformin (JANUMET) 50-1000 MG tablet Take 1 tablet by mouth daily.     Allergies:   Depo-medrol [methylprednisolone]   Social History   Socioeconomic History   Marital status: Single    Spouse name: Not on file   Number of children: 2   Years of education: Not on file   Highest education level: Not on file  Occupational History   Not on file  Tobacco Use   Smoking status: Former    Packs/day: 2.00    Types: Cigarettes    Quit date: 04/12/2004    Years since quitting: 17.5   Smokeless tobacco: Never  Substance and Sexual Activity   Alcohol use: Not Currently  Drug use: Never   Sexual activity: Not Currently  Other Topics Concern   Not on file  Social History Narrative   Lives alone   Caffeine- soda a day   Social Determinants of Health   Financial Resource Strain: Not on file  Food Insecurity: Not on file  Transportation Needs: Not on file  Physical Activity: Not on file  Stress: Not on file  Social Connections: Not on file     Family History: The patient's family history includes Cancer in her father. ROS:   Please see the history of present illness.    All 14 point review of systems negative except as described per history of present illness  EKGs/Labs/Other  Studies Reviewed:      Recent Labs: 05/01/2021: BUN 25; Creatinine, Ser 1.43; Potassium 4.5; Sodium 137 05/08/2021: NT-Pro BNP 140  Recent Lipid Panel    Component Value Date/Time   LDLDIRECT 79 05/01/2021 1027    Physical Exam:    VS:  BP 119/77 (BP Location: Left Arm, Patient Position: Sitting, Cuff Size: Normal)   Pulse 74   Ht 5\' 9"  (1.753 m)   Wt (!) 351 lb (159.2 kg)   SpO2 91%   BMI 51.83 kg/m     Wt Readings from Last 3 Encounters:  11/05/21 (!) 351 lb (159.2 kg)  11/04/21 (!) 358 lb (162.4 kg)  09/28/21 (!) 351 lb (159.2 kg)     GEN:  Well nourished, well developed in no acute distress HEENT: Normal NECK: No JVD; No carotid bruits LYMPHATICS: No lymphadenopathy CARDIAC: RRR, no murmurs, no rubs, no gallops RESPIRATORY:  Clear to auscultation without rales, wheezing or rhonchi  ABDOMEN: Soft, non-tender, non-distended MUSCULOSKELETAL:  No edema; No deformity  SKIN: Warm and dry LOWER EXTREMITIES: no swelling NEUROLOGIC:  Alert and oriented x 3 PSYCHIATRIC:  Normal affect   ASSESSMENT:    1. Pulmonary HTN (HCC)   2. Chronic diastolic congestive heart failure (HCC)   3. Obstructive sleep apnea   4. Type 2 diabetes mellitus without complication, without long-term current use of insulin (HCC)   5. Benign intracranial hypertension   6. Morbid obesity (HCC)    PLAN:    In order of problems listed above:  Pulmonary hypertension.  Last echocardiogram did not show any TR.  This is critically essential diagnosis, therefore, I asked her to have another echocardiogram done with specific attention being paid to her tricuspid regurgitation and pulmonary artery pressure.  If she truly got pulmonary hypertension this is group 2 or 3 she is awaiting CPAP mask which will be tremendously beneficial to her obesity will be very beneficial to lose significant amount of weight, in terms of diastolic dysfunction she is on diuretic I will ask her to have her proBNP done  today. Type 2 diabetes she said that she is doing well from that point review.  I do not have her hemoglobin A1c recently. Dyslipidemia she is taking Crestor 20 however I do have her K PN show me data from July of this year with LDL 123 HDL 42.  We will check her fasting lipid profile today. Benign essential hypertension blood pressure seems to be well controlled She can benefit from SGLT2 agent for her diastolic dysfunction.  We will consider Farxiga   Medication Adjustments/Labs and Tests Ordered: Current medicines are reviewed at length with the patient today.  Concerns regarding medicines are outlined above.  No orders of the defined types were placed in this encounter.  Medication changes: No orders  of the defined types were placed in this encounter.   Signed, Park Liter, MD, [facility] 11/05/2021 9:44 AM    [place]

## 2021-11-05 NOTE — Patient Instructions (Signed)
Medication Instructions:  Your physician recommends that you continue on your current medications as directed. Please refer to the Current Medication list given to you today.  *If you need a refill on your cardiac medications before your next appointment, please call your pharmacy*   Lab Work: Your physician recommends that you have labs done in the office today. Your test included  basic metabolic panel, proBNP and lipids.  If you have labs (blood work) drawn today and your tests are completely normal, you will receive your results only by: [place] (if you have MyChart) OR A paper copy in the mail If you have any lab test that is abnormal or we need to change your treatment, we will call you to review the results.   Testing/Procedures: Your physician has requested that you have an echocardiogram. Echocardiography is a painless test that uses sound waves to create images of your heart. It provides your doctor with information about the size and shape of your heart and how well your heart's chambers and valves are working. This procedure takes approximately one hour. There are no restrictions for this procedure.    Follow-Up: At [facility], you and your health needs are our priority.  As part of our continuing mission to provide you with exceptional heart care, we have created designated Provider Care Teams.  These Care Teams include your primary Cardiologist (physician) and Advanced Practice Providers (APPs -  Physician Assistants and Nurse Practitioners) who all work together to provide you with the care you need, when you need it.  We recommend signing up for the patient portal called "MyChart".  Sign up information is provided on this After Visit Summary.  MyChart is used to connect with patients for Virtual Visits (Telemedicine).  Patients are able to view lab/test results, encounter notes, upcoming appointments, etc.  Non-urgent messages can be sent to your provider as well.    To learn more about what you can do with MyChart, go to [email or web address].    Your next appointment:   6 month(s)  The format for your next appointment:   In Person  Provider:   Jenne Campus, MD   Other Instructions Echocardiogram An echocardiogram is a test that uses sound waves (ultrasound) to produce images of the heart. Images from an echocardiogram can provide important information about: Heart size and shape. The size and thickness and movement of your heart's walls. Heart muscle function and strength. Heart valve function or if you have stenosis. Stenosis is when the heart valves are too narrow. If blood is flowing backward through the heart valves (regurgitation). A tumor or infectious growth around the heart valves. Areas of heart muscle that are not working well because of poor blood flow or injury from a heart attack. Aneurysm detection. An aneurysm is a weak or damaged part of an artery wall. The wall bulges out from the normal force of blood pumping through the body. Tell a health care provider about: Any allergies you have. All medicines you are taking, including vitamins, herbs, eye drops, creams, and over-the-counter medicines. Any blood disorders you have. Any surgeries you have had. Any medical conditions you have. Whether you are pregnant or may be pregnant. What are the risks? Generally, this is a safe test. However, problems may occur, including an allergic reaction to dye (contrast) that may be used during the test. What happens before the test? No specific preparation is needed. You may eat and drink normally. What happens during the test? You will take  off your clothes from the waist up and put on a hospital gown. Electrodes or electrocardiogram (ECG)patches may be placed on your chest. The electrodes or patches are then connected to a device that monitors your heart rate and rhythm. You will lie down on a table for an ultrasound exam. A gel  will be applied to your chest to help sound waves pass through your skin. A handheld device, called a transducer, will be pressed against your chest and moved over your heart. The transducer produces sound waves that travel to your heart and bounce back (or "echo" back) to the transducer. These sound waves will be captured in real-time and changed into images of your heart that can be viewed on a video monitor. The images will be recorded on a computer and reviewed by your health care provider. You may be asked to change positions or hold your breath for a short time. This makes it easier to get different views or better views of your heart. In some cases, you may receive contrast through an IV in one of your veins. This can improve the quality of the pictures from your heart. The procedure may vary among health care providers and hospitals.   What can I expect after the test? You may return to your normal, everyday life, including diet, activities, and medicines, unless your health care provider tells you not to do that. Follow these instructions at home: It is up to you to get the results of your test. Ask your health care provider, or the department that is doing the test, when your results will be ready. Keep all follow-up visits. This is important. Summary An echocardiogram is a test that uses sound waves (ultrasound) to produce images of the heart. Images from an echocardiogram can provide important information about the size and shape of your heart, heart muscle function, heart valve function, and other possible heart problems. You do not need to do anything to prepare before this test. You may eat and drink normally. After the echocardiogram is completed, you may return to your normal, everyday life, unless your health care provider tells you not to do that. This information is not intended to replace advice given to you by your health care provider. Make sure you discuss any questions you  have with your health care provider. Document Revised: 09/25/2019 Document Reviewed: 09/25/2019 Elsevier Patient Education  2021 [employer].

## 2021-11-05 NOTE — Addendum Note (Signed)
Addended by: Truddie Hidden on: 11/05/2021 09:53 AM   Modules accepted: Orders

## 2021-11-06 LAB — LIPID PANEL
Chol/HDL Ratio: 4.7 ratio — ABNORMAL HIGH (ref 0.0–4.4)
Cholesterol, Total: 201 mg/dL — ABNORMAL HIGH (ref 100–199)
HDL: 43 mg/dL (ref >39)
LDL Chol Calc (NIH): 97 mg/dL (ref 0–99)
Triglycerides: 368 mg/dL — ABNORMAL HIGH (ref 0–149)
VLDL Cholesterol Cal: 61 mg/dL — ABNORMAL HIGH (ref 5–40)

## 2021-11-06 LAB — BASIC METABOLIC PANEL
BUN/Creatinine Ratio: 19 (ref 9–23)
BUN: 34 mg/dL — ABNORMAL HIGH (ref 6–24)
CO2: 25 mmol/L (ref 20–29)
Calcium: 9.4 mg/dL (ref 8.7–10.2)
Chloride: 98 mmol/L (ref 96–106)
Creatinine, Ser: 1.8 mg/dL — ABNORMAL HIGH (ref 0.57–1.00)
Glucose: 220 mg/dL — ABNORMAL HIGH (ref 70–99)
Potassium: 4.8 mmol/L (ref 3.5–5.2)
Sodium: 140 mmol/L (ref 134–144)
eGFR: 33 mL/min/{1.73_m2} — ABNORMAL LOW (ref >59)

## 2021-11-06 LAB — PRO B NATRIURETIC PEPTIDE: NT-Pro BNP: <36 pg/mL (ref 0–287)

## 2021-11-12 ENCOUNTER — Telehealth: Payer: Self-pay

## 2021-11-12 DIAGNOSIS — I10 Essential (primary) hypertension: Secondary | ICD-10-CM

## 2021-11-12 NOTE — Telephone Encounter (Signed)
-----   Message from Park Liter, MD sent at 11/10/2021  1:26 PM EDT ----- Her lab work test show me that her diabetes is not well controlled that is why triglycerides are elevated, she does have some kidney dysfunction I propose to repeat Chem-7 in about 3 weeks

## 2021-11-12 NOTE — Telephone Encounter (Signed)
Patient notified of results and aware of Lab work request. Order on file

## 2021-11-13 ENCOUNTER — Telehealth: Payer: Self-pay | Admitting: Pulmonary Disease

## 2021-11-13 DIAGNOSIS — I272 Pulmonary hypertension, unspecified: Secondary | ICD-10-CM

## 2021-11-13 NOTE — Telephone Encounter (Signed)
Due to insurance patient is needing oxygen order switched to [employer]. Please call patient when order is sent to [employer].

## 2021-11-13 NOTE — Telephone Encounter (Signed)
Called and spoke with patient.  Patient has oxygen she received from [place] [facility] 3 L [place] when she was discharged from [facility].  Patient has been ordered a cpap machine from Dr. Vaughan Browner.  Patient stated cpap is having to be ordered through [employer].  Patient stated she received a call from [employer] this morning letting her know she needs O2 changed from [place] [facility] to [employer].   Patient has [employer] and is needing O2 order through [employer]. DME order placed for DME change. Advised patient to call office once [employer] delivers O2 for discontinue O2 order to be placed for [facility] to pick up.

## 2021-11-15 DIAGNOSIS — E1169 Type 2 diabetes mellitus with other specified complication: Secondary | ICD-10-CM | POA: Diagnosis not present

## 2021-11-15 DIAGNOSIS — J9610 Chronic respiratory failure, unspecified whether with hypoxia or hypercapnia: Secondary | ICD-10-CM | POA: Diagnosis not present

## 2021-11-16 ENCOUNTER — Other Ambulatory Visit: Payer: Self-pay | Admitting: *Deleted

## 2021-11-16 DIAGNOSIS — I272 Pulmonary hypertension, unspecified: Secondary | ICD-10-CM

## 2021-11-19 ENCOUNTER — Ambulatory Visit: Payer: Medicare HMO | Attending: Cardiology

## 2021-11-19 DIAGNOSIS — I272 Pulmonary hypertension, unspecified: Secondary | ICD-10-CM

## 2021-11-19 DIAGNOSIS — R0609 Other forms of dyspnea: Secondary | ICD-10-CM | POA: Diagnosis not present

## 2021-11-19 DIAGNOSIS — I5032 Chronic diastolic (congestive) heart failure: Secondary | ICD-10-CM | POA: Diagnosis not present

## 2021-11-19 LAB — ECHOCARDIOGRAM COMPLETE
Area-P 1/2: 3.03 cm2
S' Lateral: 3.1 cm

## 2021-11-20 ENCOUNTER — Telehealth: Payer: Self-pay

## 2021-11-20 NOTE — Telephone Encounter (Signed)
Results reviewed with pt as per Dr. Krasowski's note.  Pt verbalized understanding and had no additional questions. Routed to PCP  

## 2021-11-25 DIAGNOSIS — E1140 Type 2 diabetes mellitus with diabetic neuropathy, unspecified: Secondary | ICD-10-CM | POA: Diagnosis not present

## 2021-11-25 DIAGNOSIS — E1169 Type 2 diabetes mellitus with other specified complication: Secondary | ICD-10-CM | POA: Diagnosis not present

## 2021-11-25 DIAGNOSIS — E1159 Type 2 diabetes mellitus with other circulatory complications: Secondary | ICD-10-CM | POA: Diagnosis not present

## 2021-12-02 DIAGNOSIS — N1832 Chronic kidney disease, stage 3b: Secondary | ICD-10-CM | POA: Diagnosis not present

## 2021-12-02 DIAGNOSIS — Z6841 Body Mass Index (BMI) 40.0 and over, adult: Secondary | ICD-10-CM | POA: Diagnosis not present

## 2021-12-02 DIAGNOSIS — E1140 Type 2 diabetes mellitus with diabetic neuropathy, unspecified: Secondary | ICD-10-CM | POA: Diagnosis not present

## 2021-12-02 DIAGNOSIS — E785 Hyperlipidemia, unspecified: Secondary | ICD-10-CM | POA: Diagnosis not present

## 2021-12-02 DIAGNOSIS — E1169 Type 2 diabetes mellitus with other specified complication: Secondary | ICD-10-CM | POA: Diagnosis not present

## 2021-12-16 DIAGNOSIS — E1169 Type 2 diabetes mellitus with other specified complication: Secondary | ICD-10-CM | POA: Diagnosis not present

## 2021-12-16 DIAGNOSIS — J9610 Chronic respiratory failure, unspecified whether with hypoxia or hypercapnia: Secondary | ICD-10-CM | POA: Diagnosis not present

## 2022-01-04 DIAGNOSIS — E1159 Type 2 diabetes mellitus with other circulatory complications: Secondary | ICD-10-CM | POA: Diagnosis not present

## 2022-01-13 DIAGNOSIS — Z139 Encounter for screening, unspecified: Secondary | ICD-10-CM | POA: Diagnosis not present

## 2022-01-13 DIAGNOSIS — Z1231 Encounter for screening mammogram for malignant neoplasm of breast: Secondary | ICD-10-CM | POA: Diagnosis not present

## 2022-01-13 DIAGNOSIS — Z0000 Encounter for general adult medical examination without abnormal findings: Secondary | ICD-10-CM | POA: Diagnosis not present

## 2022-01-13 DIAGNOSIS — E1140 Type 2 diabetes mellitus with diabetic neuropathy, unspecified: Secondary | ICD-10-CM | POA: Diagnosis not present

## 2022-01-13 DIAGNOSIS — Z1331 Encounter for screening for depression: Secondary | ICD-10-CM | POA: Diagnosis not present

## 2022-01-13 DIAGNOSIS — N1832 Chronic kidney disease, stage 3b: Secondary | ICD-10-CM | POA: Diagnosis not present

## 2022-01-13 DIAGNOSIS — Z6841 Body Mass Index (BMI) 40.0 and over, adult: Secondary | ICD-10-CM | POA: Diagnosis not present

## 2022-01-13 DIAGNOSIS — Z1339 Encounter for screening examination for other mental health and behavioral disorders: Secondary | ICD-10-CM | POA: Diagnosis not present

## 2022-01-15 DIAGNOSIS — E1140 Type 2 diabetes mellitus with diabetic neuropathy, unspecified: Secondary | ICD-10-CM | POA: Diagnosis not present

## 2022-01-15 DIAGNOSIS — J9610 Chronic respiratory failure, unspecified whether with hypoxia or hypercapnia: Secondary | ICD-10-CM | POA: Diagnosis not present

## 2022-01-18 ENCOUNTER — Ambulatory Visit (INDEPENDENT_AMBULATORY_CARE_PROVIDER_SITE_OTHER): Payer: Medicare HMO | Admitting: Pulmonary Disease

## 2022-01-18 ENCOUNTER — Encounter: Payer: Self-pay | Admitting: Pulmonary Disease

## 2022-01-18 VITALS — BP 122/64 | HR 70 | Ht 69.0 in | Wt 349.0 lb

## 2022-01-18 DIAGNOSIS — G4733 Obstructive sleep apnea (adult) (pediatric): Secondary | ICD-10-CM

## 2022-01-18 DIAGNOSIS — I272 Pulmonary hypertension, unspecified: Secondary | ICD-10-CM | POA: Diagnosis not present

## 2022-01-18 NOTE — Progress Notes (Signed)
Emily Mooney    [id number]    05-23-1965  Primary Care Physician:Webster, Morrie Sheldon, [place]  Referring Physician: Buckner Malta, PA [facility] [address] [place],  [place] [address]  Chief complaint:   Follow-up for chronic respiratory failure with hypoxia, obstructive sleep apnea, pulmonary hypertension  HPI: 56 year old with history of chronic respiratory failure, hypoxia, obstructive sleep apnea, pulmonary hypertension, pseudotumor cerebri status post VP shunt She was previously followed by Dr. 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  She has not followed up in clinic since 2020  She has been noncompliant with her medication due to change in insurance and providers.  Per patient she had a couple of hospitalizations at [facility] for heart failure requiring Lasix therapy.  She was also off oxygen for a while and had to restart She is on CPAP for OSA but stopped in January 2020 when she had a VP shunt placed as it was interfering with the incision healing but never got restarted on it  She has followed up with her primary care who has reordered the sleep study which was done last week.  Results are still pending  Pets: No pets Occupation: Retired Forensic scientist Exposures: No mold, hot tub, Jacuzzi.  No feather pillows or comforters Smoking history: 36-pack-year smoking history.  Quit at age 46 Travel history: No significant travel history Relevant family history: No family history of lung disease  Interim history: She had a VQ scan done at [place] with low probability for PE. Sleep study results from primary care reviewed with severe sleep apnea.  Started on CPAP as per titration study She started CPAP therapy and November 2024 and is doing well with 100% compliance and good response.  Outpatient Encounter Medications as of 01/18/2022  Medication Sig   carvedilol (COREG) 3.125 MG tablet Take 3.125 mg by mouth 2 (two)  times daily.   dapagliflozin propanediol (FARXIGA) 10 MG TABS tablet Take 10 mg by mouth daily.   fenofibrate micronized (LOFIBRA) 200 MG capsule Take 200 mg by mouth daily.   glipiZIDE (GLUCOTROL) 10 MG tablet Take 10 mg by mouth 2 (two) times daily before a meal.   glucose blood (ACCU-CHEK AVIVA PLUS) test strip 1 each by Other route as needed for other (Glucose).   KERENDIA 10 MG TABS Take 1 tablet by mouth daily.   lisinopril (PRINIVIL,ZESTRIL) 5 MG tablet Take 5 mg by mouth daily.   OXYGEN Inhale 3 L into the lungs as needed (Active and sleep).   rosuvastatin (CRESTOR) 20 MG tablet Take 1 tablet by mouth once daily   VASCEPA 1 g capsule Take 2 g by mouth 2 (two) times daily.   [DISCONTINUED] acetaZOLAMIDE (DIAMOX) 500 MG capsule Take 1 capsule (500 mg total) by mouth 2 (two) times daily.   [DISCONTINUED] butalbital-acetaminophen-caffeine (FIORICET, ESGIC) 50-325-40 MG tablet Take 1 tablet by mouth every 6 (six) hours as needed for headache or migraine.   [DISCONTINUED] furosemide (LASIX) 40 MG tablet Take 40 mg by mouth daily. May increase to 60mg  if weight up 3 pounds   [DISCONTINUED] sitaGLIPtin-metformin (JANUMET) 50-1000 MG tablet Take 1 tablet by mouth daily.   No facility-administered encounter medications on file as of 01/18/2022.   Physical Exam: Blood pressure 122/64, pulse 70, height 5\' 9"  (1.753 m), weight (!) 349 lb (158.3 kg), SpO2 94 %. Gen:      No acute distress HEENT:  EOMI, sclera anicteric Neck:  No masses; no thyromegaly Lungs:    Clear to auscultation bilaterally; normal respiratory effort CV:         Regular rate and rhythm; no murmurs Abd:      + bowel sounds; soft, non-tender; no palpable masses, no distension Ext:    No edema; adequate peripheral perfusion Skin:      Warm and dry; no rash Neuro: alert and oriented x 3 Psych: normal mood and affect   Data Reviewed: Imaging: High-res CT 12/12/2017-pulmonary artery dilatation, mosaic attenuation secondary  to pulmonary vascular disease.  No evidence of interstitial lung disease, mild mediastinal reactive adenopathy Chest x-ray 02/04/2021-no active cardiopulmonary disease High-res CT 03/25/2021-subtle mosaic attenuation with inspiratory and expiratory cuts.  No interstitial lung disease  VQ scan dated 04/30/2021 from [facility] No evidence of ventilatory perfusion abnormality.  Low probability for PE  I have reviewed the images personally  PFTs: 11/11/2017 FVC 2.80 [10%], FEV1 2.30 [70%], F/F 82, TLC 4.57 [78%], DLCO 18.44 [59%] Mild restrictive lung disease with severely reduced diffusion capacity  04/29/2021 FVC 2.52 [61%], FEV1 1.95 [60%], TLC 4.84 [83%], DLCO 17.29 [70%] Mild diffusion defect  Labs: CBC 08/30/2017 WBC 8.4, eos 4%, absolute eosinophil count 336  Cardiac: Echocardiogram 07/15/2017 LVEF 60 to 65%, moderate concentric LVH Moderate pulmonary hypertension, RV systolic pressure 59  Echocardiogram 05/06/2021 LVEF 60-65%, grade 2 diastolic dysfunction, normal RV systolic size and function.  No TR noted.  Sleep: PSG 09/24/2017-AHI 20.3, O2 nadir 73% CPAP titration study 12/15/2017-recommend CPAP of 17 cm  Sleep study from primary care office dated 03/02/2021 Severe obstructive sleep apnea with severe oxygen desaturation.  AHI 68.6.  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  There is no significant evidence of interstitial lung disease and high-res CT done at [facility] reviewed with mosaicism which is consistent with pulmonary vascular disease however repeat echocardiogram shows normal RV size and function with no TR.  There is no  evidence of pulm embolism on VQ scan  Chronic diastolic heart failure Follows with Dr. Townsend Roger  Plan/Recommendations: Continue CPAP and supplemental oxygen per titration study recommendation Follow-up in 6 months  Chilton Greathouse MD [facility] 01/18/2022, 9:11 AM  CC: Buckner Malta, PA

## 2022-01-18 NOTE — Patient Instructions (Signed)
Glad you are doing well with the CPAP and are tolerating therapy well Follow-up in 6 months

## 2022-01-18 NOTE — Progress Notes (Signed)
Emily Mooney    [id number]    06/03/1965  Primary Care Physician:Webster, Morrie Sheldon, [place]  Referring Physician: Buckner Malta, PA [facility] [address] [place],  [place] [address]  Chief complaint:   Follow-up for chronic respiratory failure with hypoxia, obstructive sleep apnea, pulmonary hypertension  HPI: 56 year old with history of chronic respiratory failure, hypoxia, obstructive sleep apnea, pulmonary hypertension, pseudotumor cerebri status post VP shunt She was previously followed by Dr. 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  She has not followed up in clinic since 2020  She has been noncompliant with her medication due to change in insurance and providers.  Per patient she had a couple of hospitalizations at [facility] for heart failure requiring Lasix therapy.  She was also off oxygen for a while and had to restart She is on CPAP for OSA but stopped in January 2020 when she had a VP shunt placed as it was interfering with the incision healing but never got restarted on it  She has followed up with her primary care who has reordered the sleep study which was done last week.  Results are still pending  Pets: No pets Occupation: Retired Forensic scientist Exposures: No mold, hot tub, Jacuzzi.  No feather pillows or comforters Smoking history: 36-pack-year smoking history.  Quit at age 34 Travel history: No significant travel history Relevant family history: No family history of lung disease  Interim history: She had a VQ scan done at [place] with low probability for PE. Sleep study results from primary care reviewed with severe sleep apnea.  CPAP has been ordered by her primary care but she has not received it yet due to insurance reasons.  Outpatient Encounter Medications as of 01/18/2022  Medication Sig   carvedilol (COREG) 3.125 MG tablet Take 3.125 mg by mouth 2 (two) times daily.   dapagliflozin propanediol  (FARXIGA) 10 MG TABS tablet Take 10 mg by mouth daily.   fenofibrate micronized (LOFIBRA) 200 MG capsule Take 200 mg by mouth daily.   glipiZIDE (GLUCOTROL) 10 MG tablet Take 10 mg by mouth 2 (two) times daily before a meal.   glucose blood (ACCU-CHEK AVIVA PLUS) test strip 1 each by Other route as needed for other (Glucose).   KERENDIA 10 MG TABS Take 1 tablet by mouth daily.   lisinopril (PRINIVIL,ZESTRIL) 5 MG tablet Take 5 mg by mouth daily.   OXYGEN Inhale 3 L into the lungs as needed (Active and sleep).   rosuvastatin (CRESTOR) 20 MG tablet Take 1 tablet by mouth once daily   VASCEPA 1 g capsule Take 2 g by mouth 2 (two) times daily.   [DISCONTINUED] acetaZOLAMIDE (DIAMOX) 500 MG capsule Take 1 capsule (500 mg total) by mouth 2 (two) times daily.   [DISCONTINUED] butalbital-acetaminophen-caffeine (FIORICET, ESGIC) 50-325-40 MG tablet Take 1 tablet by mouth every 6 (six) hours as needed for headache or migraine.   [DISCONTINUED] furosemide (LASIX) 40 MG tablet Take 40 mg by mouth daily. May increase to 60mg  if weight up 3 pounds   [DISCONTINUED] sitaGLIPtin-metformin (JANUMET) 50-1000 MG tablet Take 1 tablet by mouth daily.   No facility-administered encounter medications on file as of 01/18/2022.   Physical Exam: Gen:      No acute distress HEENT:  EOMI, sclera anicteric Neck:     No masses; no thyromegaly Lungs:    Clear to auscultation bilaterally; normal respiratory effort CV:  Regular rate and rhythm; no murmurs Abd:      + bowel sounds; soft, non-tender; no palpable masses, no distension Ext:    No edema; adequate peripheral perfusion Skin:      Warm and dry; no rash Neuro: alert and oriented x 3 Psych: normal mood and affect   Data Reviewed: Imaging: High-res CT 12/12/2017-pulmonary artery dilatation, mosaic attenuation secondary to pulmonary vascular disease.  No evidence of interstitial lung disease, mild mediastinal reactive adenopathy Chest x-ray 02/04/2021-no  active cardiopulmonary disease High-res CT 03/25/2021-subtle mosaic attenuation with inspiratory and expiratory cuts.  No interstitial lung disease  VQ scan dated 04/30/2021 from [facility] No evidence of ventilatory perfusion abnormality.  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  No TR noted.  Sleep: PSG 09/24/2017-AHI 20.3, O2 nadir 73% CPAP titration study 12/15/2017-recommend CPAP of 17 cm  Sleep study from primary care office dated 03/02/2021 Severe obstructive sleep apnea with severe oxygen desaturation.  AHI 68.6.  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  There is no evidence of pulm embolism on VQ scan  Chronic diastolic heart failure Follows with Dr. Edyth Gunnels  Plan/Recommendations: Order CPAP and supplemental oxygen per titration study  recommendation  Marshell Garfinkel MD [facility] 01/18/2022, 9:13 AM  CC: Lawson Radar, PA

## 2022-01-21 DIAGNOSIS — J449 Chronic obstructive pulmonary disease, unspecified: Secondary | ICD-10-CM | POA: Diagnosis not present

## 2022-01-21 DIAGNOSIS — B070 Plantar wart: Secondary | ICD-10-CM | POA: Diagnosis not present

## 2022-02-15 DIAGNOSIS — E1140 Type 2 diabetes mellitus with diabetic neuropathy, unspecified: Secondary | ICD-10-CM | POA: Diagnosis not present

## 2022-02-16 DIAGNOSIS — Z1231 Encounter for screening mammogram for malignant neoplasm of breast: Secondary | ICD-10-CM | POA: Diagnosis not present

## 2022-02-26 DIAGNOSIS — E1140 Type 2 diabetes mellitus with diabetic neuropathy, unspecified: Secondary | ICD-10-CM | POA: Diagnosis not present

## 2022-02-26 DIAGNOSIS — E1169 Type 2 diabetes mellitus with other specified complication: Secondary | ICD-10-CM | POA: Diagnosis not present

## 2022-02-26 DIAGNOSIS — E1159 Type 2 diabetes mellitus with other circulatory complications: Secondary | ICD-10-CM | POA: Diagnosis not present

## 2022-03-03 DIAGNOSIS — E1159 Type 2 diabetes mellitus with other circulatory complications: Secondary | ICD-10-CM | POA: Diagnosis not present

## 2022-03-03 DIAGNOSIS — N1832 Chronic kidney disease, stage 3b: Secondary | ICD-10-CM | POA: Diagnosis not present

## 2022-03-03 DIAGNOSIS — I152 Hypertension secondary to endocrine disorders: Secondary | ICD-10-CM | POA: Diagnosis not present

## 2022-03-03 DIAGNOSIS — E1169 Type 2 diabetes mellitus with other specified complication: Secondary | ICD-10-CM | POA: Diagnosis not present

## 2022-03-03 DIAGNOSIS — Z6841 Body Mass Index (BMI) 40.0 and over, adult: Secondary | ICD-10-CM | POA: Diagnosis not present

## 2022-03-03 DIAGNOSIS — E785 Hyperlipidemia, unspecified: Secondary | ICD-10-CM | POA: Diagnosis not present

## 2022-03-18 DIAGNOSIS — Z6841 Body Mass Index (BMI) 40.0 and over, adult: Secondary | ICD-10-CM | POA: Diagnosis not present

## 2022-03-18 DIAGNOSIS — E1169 Type 2 diabetes mellitus with other specified complication: Secondary | ICD-10-CM | POA: Diagnosis not present

## 2022-04-09 DIAGNOSIS — G932 Benign intracranial hypertension: Secondary | ICD-10-CM | POA: Diagnosis not present

## 2022-04-16 DIAGNOSIS — E1169 Type 2 diabetes mellitus with other specified complication: Secondary | ICD-10-CM | POA: Diagnosis not present

## 2022-04-16 DIAGNOSIS — E785 Hyperlipidemia, unspecified: Secondary | ICD-10-CM | POA: Diagnosis not present

## 2022-04-16 DIAGNOSIS — I152 Hypertension secondary to endocrine disorders: Secondary | ICD-10-CM | POA: Diagnosis not present

## 2022-05-07 ENCOUNTER — Ambulatory Visit: Payer: Medicare HMO | Attending: Cardiology | Admitting: Cardiology

## 2022-05-07 ENCOUNTER — Encounter: Payer: Self-pay | Admitting: Cardiology

## 2022-05-07 VITALS — BP 92/63 | HR 127 | Ht 69.0 in | Wt 349.0 lb

## 2022-05-07 DIAGNOSIS — I10 Essential (primary) hypertension: Secondary | ICD-10-CM | POA: Diagnosis not present

## 2022-05-07 DIAGNOSIS — G4733 Obstructive sleep apnea (adult) (pediatric): Secondary | ICD-10-CM | POA: Diagnosis not present

## 2022-05-07 DIAGNOSIS — I5032 Chronic diastolic (congestive) heart failure: Secondary | ICD-10-CM

## 2022-05-07 DIAGNOSIS — I272 Pulmonary hypertension, unspecified: Secondary | ICD-10-CM

## 2022-05-07 NOTE — Patient Instructions (Signed)

## 2022-05-07 NOTE — Progress Notes (Signed)
Cardiology Office Note:    Date:  05/07/2022   ID:  Emily Mooney, DOB 04/01/1965, MRN [id number]  PCP:  Lawson Radar, PA  Cardiologist:  Jenne Campus, MD    Referring MD: Lawson Radar, [place]   Chief Complaint  Patient presents with   Follow-up  Doing very well  History of Present Illness:    Emily Mooney is a 57 y.o. female past medical history significant for questionable pulmonary hypertension, morbid obesity, essential hypertension, obstructive sleep apnea, diastolic dysfunction.  Initially she was referred to [place] for evaluation of pulmonary hypertension she did have so far 2 echocardiograms which showed normal right ventricle size and function, no significant tricuspid regurgitation, so pulmonary hypertension could not be assessed but it is impossible to have significant pulmonary hypertension without significant mitral regurgitation, therefore, I doubt very much that diagnosis. She comes today to months for follow-up we will doing well.  She denies any chest pain tightness squeezing pressure mid chest.  Past Medical History:  Diagnosis Date   CHF (congestive heart failure) (HCC)    Diabetes mellitus without complication (HCC)    Headache    IIH (idiopathic intracranial hypertension)    Numbness in feet     Past Surgical History:  Procedure Laterality Date   TOOTH EXTRACTION     TUBAL LIGATION      Current Medications: Current Meds  Medication Sig   carvedilol (COREG) 3.125 MG tablet Take 3.125 mg by mouth 2 (two) times daily.   dapagliflozin propanediol (FARXIGA) 10 MG TABS tablet Take 10 mg by mouth daily.   fenofibrate micronized (LOFIBRA) 200 MG capsule Take 200 mg by mouth daily.   glipiZIDE (GLUCOTROL) 10 MG tablet Take 10 mg by mouth 2 (two) times daily before a meal.   glucose blood (ACCU-CHEK AVIVA PLUS) test strip 1 each by Other route as needed for other (Glucose).   KERENDIA 10 MG TABS Take 1 tablet by mouth daily.   lisinopril (ZESTRIL) 10 MG tablet  Take 10 mg by mouth daily.   OXYGEN Inhale 3 L into the lungs as needed (Active and sleep).   OZEMPIC, 2 MG/DOSE, 8 MG/3ML SOPN Inject 2 mg into the skin once a week.   rosuvastatin (CRESTOR) 20 MG tablet Take 1 tablet by mouth once daily   VASCEPA 1 g capsule Take 2 g by mouth 2 (two) times daily.     Allergies:   Depo-medrol [methylprednisolone]   Social History   Socioeconomic History   Marital status: Single    Spouse name: Not on file   Number of children: 2   Years of education: Not on file   Highest education level: Not on file  Occupational History   Not on file  Tobacco Use   Smoking status: Former    Packs/day: 2    Types: Cigarettes    Quit date: 04/12/2004    Years since quitting: 18.0   Smokeless tobacco: Never  Substance and Sexual Activity   Alcohol use: Not Currently   Drug use: Never   Sexual activity: Not Currently  Other Topics Concern   Not on file  Social History Narrative   Lives alone   Caffeine- soda a day   Social Determinants of Health   Financial Resource Strain: Not on file  Food Insecurity: Not on file  Transportation Needs: Not on file  Physical Activity: Not on file  Stress: Not on file  Social Connections: Not on file     Family History: The patient's family  history includes Cancer in her father. ROS:   Please see the history of present illness.    All 14 point review of systems negative except as described per history of present illness  EKGs/Labs/Other Studies Reviewed:      Recent Labs: 11/05/2021: BUN 34; Creatinine, Ser 1.80; NT-Pro BNP <36; Potassium 4.8; Sodium 140  Recent Lipid Panel    Component Value Date/Time   CHOL 201 (H) 11/05/2021 1006   TRIG 368 (H) 11/05/2021 1006   HDL 43 11/05/2021 1006   CHOLHDL 4.7 (H) 11/05/2021 1006   LDLCALC 97 11/05/2021 1006   LDLDIRECT 79 05/01/2021 1027    Physical Exam:    VS:  BP 92/63 (BP Location: Right Arm, Patient Position: Sitting, Cuff Size: Normal)   Pulse (!)  127   Ht 5\' 9"  (1.753 m)   Wt (!) 349 lb (158.3 kg)   SpO2 90%   BMI 51.54 kg/m     Wt Readings from Last 3 Encounters:  05/07/22 (!) 349 lb (158.3 kg)  01/18/22 (!) 349 lb (158.3 kg)  11/05/21 (!) 351 lb (159.2 kg)     GEN:  Well nourished, well developed in no acute distress HEENT: Normal NECK: No JVD; No carotid bruits LYMPHATICS: No lymphadenopathy CARDIAC: RRR, no murmurs, no rubs, no gallops RESPIRATORY:  Clear to auscultation without rales, wheezing or rhonchi  ABDOMEN: Soft, non-tender, non-distended MUSCULOSKELETAL:  No edema; No deformity  SKIN: Warm and dry LOWER EXTREMITIES: no swelling NEUROLOGIC:  Alert and oriented x 3 PSYCHIATRIC:  Normal affect   ASSESSMENT:    1. Chronic diastolic congestive heart failure ([place])   2. Pulmonary HTN ([place])   3. Primary hypertension   4. Obstructive sleep apnea   5. Morbid obesity ([place])    PLAN:    In order of problems listed above:  Chronic diastolic congestive heart failure patient to be compensated on appropriate medication which include Wilder Glade which I will continue.  She does not have evidence of fluid overload she looks euvolemic we will continue present management Essential hypertension blood pressure well-controlled actually slightly on the lower side today continue monitoring. Pulmonary hypertension As discussed above I doubt in this diagnosis she had 2 echocardiograms which did not show any enlargement thickening of the wall of the right ventricle on top of that there is no tricuspid regurgitation and it is practically impossible to have severe or significant pulmonary hypertension without some regurgitation, therefore, I doubt it is diagnosis regardless if she does have pulmonary hypertension she is being well-managed for her obstructive sleep apnea as well as hypertension and congestive heart failure. Obstructive sleep apnea use CPAP mask on the regular basis with good results   Medication Adjustments/Labs and  Tests Ordered: Current medicines are reviewed at length with the patient today.  Concerns regarding medicines are outlined above.  No orders of the defined types were placed in this encounter.  Medication changes: No orders of the defined types were placed in this encounter.   Signed, Park Liter, MD, [facility] 05/07/2022 10:27 AM    [place]

## 2022-05-17 DIAGNOSIS — E785 Hyperlipidemia, unspecified: Secondary | ICD-10-CM | POA: Diagnosis not present

## 2022-05-17 DIAGNOSIS — E1169 Type 2 diabetes mellitus with other specified complication: Secondary | ICD-10-CM | POA: Diagnosis not present

## 2022-05-31 DIAGNOSIS — E1169 Type 2 diabetes mellitus with other specified complication: Secondary | ICD-10-CM | POA: Diagnosis not present

## 2022-05-31 DIAGNOSIS — E1159 Type 2 diabetes mellitus with other circulatory complications: Secondary | ICD-10-CM | POA: Diagnosis not present

## 2022-05-31 DIAGNOSIS — E1140 Type 2 diabetes mellitus with diabetic neuropathy, unspecified: Secondary | ICD-10-CM | POA: Diagnosis not present

## 2022-06-16 DIAGNOSIS — E785 Hyperlipidemia, unspecified: Secondary | ICD-10-CM | POA: Diagnosis not present

## 2022-06-16 DIAGNOSIS — E1169 Type 2 diabetes mellitus with other specified complication: Secondary | ICD-10-CM | POA: Diagnosis not present

## 2022-06-17 DIAGNOSIS — I152 Hypertension secondary to endocrine disorders: Secondary | ICD-10-CM | POA: Diagnosis not present

## 2022-06-17 DIAGNOSIS — N1832 Chronic kidney disease, stage 3b: Secondary | ICD-10-CM | POA: Diagnosis not present

## 2022-06-17 DIAGNOSIS — E1140 Type 2 diabetes mellitus with diabetic neuropathy, unspecified: Secondary | ICD-10-CM | POA: Diagnosis not present

## 2022-06-17 DIAGNOSIS — E1159 Type 2 diabetes mellitus with other circulatory complications: Secondary | ICD-10-CM | POA: Diagnosis not present

## 2022-06-17 DIAGNOSIS — Z6841 Body Mass Index (BMI) 40.0 and over, adult: Secondary | ICD-10-CM | POA: Diagnosis not present

## 2022-07-17 DIAGNOSIS — I152 Hypertension secondary to endocrine disorders: Secondary | ICD-10-CM | POA: Diagnosis not present

## 2022-07-17 DIAGNOSIS — E1140 Type 2 diabetes mellitus with diabetic neuropathy, unspecified: Secondary | ICD-10-CM | POA: Diagnosis not present

## 2022-08-16 DIAGNOSIS — E1140 Type 2 diabetes mellitus with diabetic neuropathy, unspecified: Secondary | ICD-10-CM | POA: Diagnosis not present

## 2022-08-16 DIAGNOSIS — I152 Hypertension secondary to endocrine disorders: Secondary | ICD-10-CM | POA: Diagnosis not present

## 2022-09-16 DIAGNOSIS — E1140 Type 2 diabetes mellitus with diabetic neuropathy, unspecified: Secondary | ICD-10-CM | POA: Diagnosis not present

## 2022-09-16 DIAGNOSIS — E1159 Type 2 diabetes mellitus with other circulatory complications: Secondary | ICD-10-CM | POA: Diagnosis not present

## 2022-09-16 DIAGNOSIS — I152 Hypertension secondary to endocrine disorders: Secondary | ICD-10-CM | POA: Diagnosis not present

## 2022-09-23 DIAGNOSIS — E1140 Type 2 diabetes mellitus with diabetic neuropathy, unspecified: Secondary | ICD-10-CM | POA: Diagnosis not present

## 2022-09-23 DIAGNOSIS — N179 Acute kidney failure, unspecified: Secondary | ICD-10-CM | POA: Diagnosis not present

## 2022-09-23 DIAGNOSIS — J9611 Chronic respiratory failure with hypoxia: Secondary | ICD-10-CM | POA: Diagnosis not present

## 2022-09-23 DIAGNOSIS — N1832 Chronic kidney disease, stage 3b: Secondary | ICD-10-CM | POA: Diagnosis not present

## 2022-10-01 DIAGNOSIS — N1832 Chronic kidney disease, stage 3b: Secondary | ICD-10-CM | POA: Diagnosis not present

## 2022-10-06 ENCOUNTER — Encounter: Payer: Self-pay | Admitting: Pulmonary Disease

## 2022-10-06 ENCOUNTER — Ambulatory Visit (INDEPENDENT_AMBULATORY_CARE_PROVIDER_SITE_OTHER): Payer: Medicare HMO | Admitting: Pulmonary Disease

## 2022-10-06 VITALS — BP 122/68 | HR 72 | Ht 69.0 in | Wt 354.2 lb

## 2022-10-06 DIAGNOSIS — I272 Pulmonary hypertension, unspecified: Secondary | ICD-10-CM

## 2022-10-06 DIAGNOSIS — G4733 Obstructive sleep apnea (adult) (pediatric): Secondary | ICD-10-CM

## 2022-10-06 NOTE — Patient Instructions (Addendum)
I am glad you are doing well with your breathing and using the CPAP well Follow-up in 1 year

## 2022-10-06 NOTE — Progress Notes (Signed)
Emily Mooney    [id number]    27-Aug-1965  Primary Care Physician:Webster, Morrie Sheldon, [place]  Referring Physician: Buckner Malta, PA [facility] [address] [place],  [place] [address]  Chief complaint:   Follow-up for chronic respiratory failure with hypoxia, obstructive sleep apnea, pulmonary hypertension  HPI: 57 y.o. with history of chronic respiratory failure, hypoxia, obstructive sleep apnea, pulmonary hypertension, pseudotumor cerebri status post VP shunt She was previously followed by Dr. 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  She has not followed up in clinic since 2020  She has been noncompliant with her medication due to change in insurance and providers.  Per patient she had a couple of hospitalizations at [facility] for heart failure requiring Lasix therapy.  She was also off oxygen for a while and had to restart She is on CPAP for OSA but stopped in January 2020 when she had a VP shunt placed as it was interfering with the incision healing but never got restarted on it  She had a VQ scan done at [facility] March 2023 with low probability for PE. Sleep study results from primary care reviewed with severe sleep apnea.  Started on CPAP as per titration study She started CPAP therapy in November 2024   Pets: No pets Occupation: Retired Forensic scientist Exposures: No mold, hot tub, Financial controller.  No feather pillows or comforters Smoking history: 36-pack-year smoking history.  Quit at age 49 Travel history: No significant travel history Relevant family history: No family history of lung disease  Interim history: Is doing well.  She is using CPAP every day and likes it Continues on supplemental oxygen.  Outpatient Encounter Medications as of 10/06/2022  Medication Sig   carvedilol (COREG) 3.125 MG tablet Take 3.125 mg by mouth 2 (two) times daily.   dapagliflozin propanediol (FARXIGA) 10 MG TABS tablet Take 10 mg by mouth  daily.   fenofibrate micronized (LOFIBRA) 200 MG capsule Take 200 mg by mouth daily.   glipiZIDE (GLUCOTROL) 10 MG tablet Take 10 mg by mouth 2 (two) times daily before a meal.   glucose blood (ACCU-CHEK AVIVA PLUS) test strip 1 each by Other route as needed for other (Glucose).   KERENDIA 10 MG TABS Take 1 tablet by mouth daily.   OXYGEN Inhale 3 L into the lungs as needed (Active and sleep).   OZEMPIC, 2 MG/DOSE, 8 MG/3ML SOPN Inject 2 mg into the skin once a week.   rosuvastatin (CRESTOR) 20 MG tablet Take 1 tablet by mouth once daily   VASCEPA 1 g capsule Take 2 g by mouth 2 (two) times daily.   lisinopril (ZESTRIL) 10 MG tablet Take 10 mg by mouth daily.   No facility-administered encounter medications on file as of 10/06/2022.   Physical Exam: Blood pressure 122/68, pulse 72, height 5\' 9"  (1.753 m), weight (!) 354 lb 3.2 oz (160.7 kg), SpO2 95%. Gen:      No acute distress HEENT:  EOMI, sclera anicteric Neck:     No masses; no thyromegaly Lungs:    Clear to auscultation bilaterally; normal respiratory effort\ CV:         Regular rate and rhythm; no murmurs Abd:      + bowel sounds; soft, non-tender; no palpable masses, no distension Ext:    No edema; adequate peripheral perfusion Skin:      Warm and dry; no rash Neuro: alert and oriented x 3  Psych: normal mood and affect   Data Reviewed: Imaging: High-res CT 12/12/2017-pulmonary artery dilatation, mosaic attenuation secondary to pulmonary vascular disease.  No evidence of interstitial lung disease, mild mediastinal reactive adenopathy Chest x-ray 02/04/2021-no active cardiopulmonary disease High-res CT 03/25/2021-subtle mosaic attenuation with inspiratory and expiratory cuts.  No interstitial lung disease  VQ scan dated 04/30/2021 from [facility] No evidence of ventilatory perfusion abnormality.  Low probability for PE  I have reviewed the images personally  PFTs: 11/11/2017 FVC 2.80 [10%], FEV1 2.30 [70%], F/F 82, TLC  4.57 [78%], DLCO 18.44 [59%] Mild restrictive lung disease with severely reduced diffusion capacity  04/29/2021 FVC 2.52 [61%], FEV1 1.95 [60%], TLC 4.84 [83%], DLCO 17.29 [70%] Mild diffusion defect  Labs: CBC 08/30/2017 WBC 8.4, eos 4%, absolute eosinophil count 336  Cardiac: Echocardiogram 07/15/2017 LVEF 60 to 65%, moderate concentric LVH Moderate pulmonary hypertension, RV systolic pressure 59  Echocardiogram 05/06/2021 LVEF 60-65%, grade 2 diastolic dysfunction, normal RV systolic size and function.  No TR noted.  Sleep: PSG 09/24/2017-AHI 20.3, O2 nadir 73% CPAP titration study 12/15/2017-recommend CPAP of 17 cm  Sleep study from primary care office dated 03/02/2021 Severe obstructive sleep apnea with severe oxygen desaturation.  AHI 68.6.  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  Download reviewed with good response and compliance.  There is no significant evidence of interstitial lung disease and high-res CT done at [facility] reviewed with mosaicism which is consistent with pulmonary vascular disease however repeat echocardiogram shows normal RV size and function with no TR.  There is no evidence of pulm embolism on VQ scan  Chronic diastolic heart failure, pulmonary hypertension Follows with Dr. Townsend Roger  Plan/Recommendations: Continue CPAP and supplemental oxygen per titration study recommendation Follow-up in 1 year  Chilton Greathouse MD [facility] 10/06/2022, 9:52 AM  CC: Buckner Malta, PA

## 2022-10-12 DIAGNOSIS — N184 Chronic kidney disease, stage 4 (severe): Secondary | ICD-10-CM | POA: Diagnosis not present

## 2022-10-12 DIAGNOSIS — Z6841 Body Mass Index (BMI) 40.0 and over, adult: Secondary | ICD-10-CM | POA: Diagnosis not present

## 2022-10-12 DIAGNOSIS — E1122 Type 2 diabetes mellitus with diabetic chronic kidney disease: Secondary | ICD-10-CM | POA: Diagnosis not present

## 2022-10-12 DIAGNOSIS — Z1231 Encounter for screening mammogram for malignant neoplasm of breast: Secondary | ICD-10-CM | POA: Diagnosis not present

## 2022-10-17 DIAGNOSIS — E1122 Type 2 diabetes mellitus with diabetic chronic kidney disease: Secondary | ICD-10-CM | POA: Diagnosis not present

## 2022-10-17 DIAGNOSIS — N184 Chronic kidney disease, stage 4 (severe): Secondary | ICD-10-CM | POA: Diagnosis not present

## 2022-11-11 DIAGNOSIS — G4733 Obstructive sleep apnea (adult) (pediatric): Secondary | ICD-10-CM | POA: Diagnosis not present

## 2022-11-11 DIAGNOSIS — N179 Acute kidney failure, unspecified: Secondary | ICD-10-CM | POA: Diagnosis not present

## 2022-11-11 DIAGNOSIS — E119 Type 2 diabetes mellitus without complications: Secondary | ICD-10-CM | POA: Diagnosis not present

## 2022-11-11 DIAGNOSIS — N189 Chronic kidney disease, unspecified: Secondary | ICD-10-CM | POA: Diagnosis not present

## 2022-11-11 DIAGNOSIS — E669 Obesity, unspecified: Secondary | ICD-10-CM | POA: Diagnosis not present

## 2022-11-11 DIAGNOSIS — E785 Hyperlipidemia, unspecified: Secondary | ICD-10-CM | POA: Diagnosis not present

## 2022-11-11 DIAGNOSIS — N184 Chronic kidney disease, stage 4 (severe): Secondary | ICD-10-CM | POA: Diagnosis not present

## 2022-11-11 DIAGNOSIS — I129 Hypertensive chronic kidney disease with stage 1 through stage 4 chronic kidney disease, or unspecified chronic kidney disease: Secondary | ICD-10-CM | POA: Diagnosis not present

## 2022-11-16 DIAGNOSIS — E1122 Type 2 diabetes mellitus with diabetic chronic kidney disease: Secondary | ICD-10-CM | POA: Diagnosis not present

## 2022-11-16 DIAGNOSIS — N184 Chronic kidney disease, stage 4 (severe): Secondary | ICD-10-CM | POA: Diagnosis not present

## 2022-12-17 DIAGNOSIS — E1122 Type 2 diabetes mellitus with diabetic chronic kidney disease: Secondary | ICD-10-CM | POA: Diagnosis not present

## 2022-12-17 DIAGNOSIS — N184 Chronic kidney disease, stage 4 (severe): Secondary | ICD-10-CM | POA: Diagnosis not present

## 2022-12-27 DIAGNOSIS — E1159 Type 2 diabetes mellitus with other circulatory complications: Secondary | ICD-10-CM | POA: Diagnosis not present

## 2022-12-27 DIAGNOSIS — E1122 Type 2 diabetes mellitus with diabetic chronic kidney disease: Secondary | ICD-10-CM | POA: Diagnosis not present

## 2023-01-05 DIAGNOSIS — E1169 Type 2 diabetes mellitus with other specified complication: Secondary | ICD-10-CM | POA: Diagnosis not present

## 2023-01-05 DIAGNOSIS — E785 Hyperlipidemia, unspecified: Secondary | ICD-10-CM | POA: Diagnosis not present

## 2023-01-05 DIAGNOSIS — Z6841 Body Mass Index (BMI) 40.0 and over, adult: Secondary | ICD-10-CM | POA: Diagnosis not present

## 2023-01-05 DIAGNOSIS — N1832 Chronic kidney disease, stage 3b: Secondary | ICD-10-CM | POA: Diagnosis not present

## 2023-01-05 DIAGNOSIS — Z0000 Encounter for general adult medical examination without abnormal findings: Secondary | ICD-10-CM | POA: Diagnosis not present

## 2023-01-05 DIAGNOSIS — Z139 Encounter for screening, unspecified: Secondary | ICD-10-CM | POA: Diagnosis not present

## 2023-01-05 DIAGNOSIS — Z1331 Encounter for screening for depression: Secondary | ICD-10-CM | POA: Diagnosis not present

## 2023-01-05 DIAGNOSIS — Z1339 Encounter for screening examination for other mental health and behavioral disorders: Secondary | ICD-10-CM | POA: Diagnosis not present

## 2023-01-06 DIAGNOSIS — E1159 Type 2 diabetes mellitus with other circulatory complications: Secondary | ICD-10-CM | POA: Diagnosis not present

## 2023-01-06 DIAGNOSIS — E1169 Type 2 diabetes mellitus with other specified complication: Secondary | ICD-10-CM | POA: Diagnosis not present

## 2023-01-16 DIAGNOSIS — E785 Hyperlipidemia, unspecified: Secondary | ICD-10-CM | POA: Diagnosis not present

## 2023-01-16 DIAGNOSIS — N1832 Chronic kidney disease, stage 3b: Secondary | ICD-10-CM | POA: Diagnosis not present

## 2023-01-16 DIAGNOSIS — E1169 Type 2 diabetes mellitus with other specified complication: Secondary | ICD-10-CM | POA: Diagnosis not present

## 2023-01-17 DIAGNOSIS — G932 Benign intracranial hypertension: Secondary | ICD-10-CM | POA: Diagnosis not present

## 2023-03-19 DIAGNOSIS — E1169 Type 2 diabetes mellitus with other specified complication: Secondary | ICD-10-CM | POA: Diagnosis not present

## 2023-03-19 DIAGNOSIS — E785 Hyperlipidemia, unspecified: Secondary | ICD-10-CM | POA: Diagnosis not present

## 2023-03-19 DIAGNOSIS — I509 Heart failure, unspecified: Secondary | ICD-10-CM | POA: Diagnosis not present

## 2023-04-02 DIAGNOSIS — I5030 Unspecified diastolic (congestive) heart failure: Secondary | ICD-10-CM | POA: Diagnosis not present

## 2023-04-02 DIAGNOSIS — I272 Pulmonary hypertension, unspecified: Secondary | ICD-10-CM | POA: Diagnosis not present

## 2023-04-08 DIAGNOSIS — E1122 Type 2 diabetes mellitus with diabetic chronic kidney disease: Secondary | ICD-10-CM | POA: Diagnosis not present

## 2023-04-08 DIAGNOSIS — E1159 Type 2 diabetes mellitus with other circulatory complications: Secondary | ICD-10-CM | POA: Diagnosis not present

## 2023-04-14 DIAGNOSIS — E785 Hyperlipidemia, unspecified: Secondary | ICD-10-CM | POA: Diagnosis not present

## 2023-04-14 DIAGNOSIS — E1169 Type 2 diabetes mellitus with other specified complication: Secondary | ICD-10-CM | POA: Diagnosis not present

## 2023-04-14 DIAGNOSIS — I10 Essential (primary) hypertension: Secondary | ICD-10-CM | POA: Diagnosis not present

## 2023-04-14 DIAGNOSIS — N1832 Chronic kidney disease, stage 3b: Secondary | ICD-10-CM | POA: Diagnosis not present

## 2023-04-16 DIAGNOSIS — I509 Heart failure, unspecified: Secondary | ICD-10-CM | POA: Diagnosis not present

## 2023-04-16 DIAGNOSIS — E1169 Type 2 diabetes mellitus with other specified complication: Secondary | ICD-10-CM | POA: Diagnosis not present

## 2023-04-16 DIAGNOSIS — E785 Hyperlipidemia, unspecified: Secondary | ICD-10-CM | POA: Diagnosis not present

## 2023-04-30 DIAGNOSIS — I272 Pulmonary hypertension, unspecified: Secondary | ICD-10-CM | POA: Diagnosis not present

## 2023-04-30 DIAGNOSIS — I5030 Unspecified diastolic (congestive) heart failure: Secondary | ICD-10-CM | POA: Diagnosis not present

## 2023-05-04 DIAGNOSIS — E119 Type 2 diabetes mellitus without complications: Secondary | ICD-10-CM | POA: Diagnosis not present

## 2023-05-04 DIAGNOSIS — E785 Hyperlipidemia, unspecified: Secondary | ICD-10-CM | POA: Diagnosis not present

## 2023-05-04 DIAGNOSIS — N189 Chronic kidney disease, unspecified: Secondary | ICD-10-CM | POA: Diagnosis not present

## 2023-05-04 DIAGNOSIS — I129 Hypertensive chronic kidney disease with stage 1 through stage 4 chronic kidney disease, or unspecified chronic kidney disease: Secondary | ICD-10-CM | POA: Diagnosis not present

## 2023-05-04 DIAGNOSIS — J9611 Chronic respiratory failure with hypoxia: Secondary | ICD-10-CM | POA: Diagnosis not present

## 2023-05-04 DIAGNOSIS — I509 Heart failure, unspecified: Secondary | ICD-10-CM | POA: Diagnosis not present

## 2023-05-04 DIAGNOSIS — N179 Acute kidney failure, unspecified: Secondary | ICD-10-CM | POA: Diagnosis not present

## 2023-05-09 DIAGNOSIS — G932 Benign intracranial hypertension: Secondary | ICD-10-CM | POA: Diagnosis not present

## 2023-05-17 DIAGNOSIS — E1169 Type 2 diabetes mellitus with other specified complication: Secondary | ICD-10-CM | POA: Diagnosis not present

## 2023-05-17 DIAGNOSIS — N1832 Chronic kidney disease, stage 3b: Secondary | ICD-10-CM | POA: Diagnosis not present

## 2023-05-31 DIAGNOSIS — I5030 Unspecified diastolic (congestive) heart failure: Secondary | ICD-10-CM | POA: Diagnosis not present

## 2023-05-31 DIAGNOSIS — I272 Pulmonary hypertension, unspecified: Secondary | ICD-10-CM | POA: Diagnosis not present

## 2023-06-15 DIAGNOSIS — G4733 Obstructive sleep apnea (adult) (pediatric): Secondary | ICD-10-CM | POA: Diagnosis not present

## 2023-06-16 DIAGNOSIS — N1832 Chronic kidney disease, stage 3b: Secondary | ICD-10-CM | POA: Diagnosis not present

## 2023-06-16 DIAGNOSIS — E1169 Type 2 diabetes mellitus with other specified complication: Secondary | ICD-10-CM | POA: Diagnosis not present

## 2023-06-22 ENCOUNTER — Telehealth: Payer: Self-pay | Admitting: Diagnostic Neuroimaging

## 2023-06-22 NOTE — Telephone Encounter (Signed)
 Received call from [facility], states patient is having vision changes, IIH needs appt, they sent to [facility] but they declined since she is established here. Patient was last seen 01/2021 for this-can be scheduled for follow up office visit, but she does have a balance with us , will need to make a payment. She stated she would let patient know she needs to call us  to do this and after that can schedule for OV. She is also going to send over OV notes

## 2023-06-28 DIAGNOSIS — Z1231 Encounter for screening mammogram for malignant neoplasm of breast: Secondary | ICD-10-CM | POA: Diagnosis not present

## 2023-06-30 DIAGNOSIS — I5030 Unspecified diastolic (congestive) heart failure: Secondary | ICD-10-CM | POA: Diagnosis not present

## 2023-06-30 DIAGNOSIS — I272 Pulmonary hypertension, unspecified: Secondary | ICD-10-CM | POA: Diagnosis not present

## 2023-07-07 DIAGNOSIS — E1159 Type 2 diabetes mellitus with other circulatory complications: Secondary | ICD-10-CM | POA: Diagnosis not present

## 2023-07-07 DIAGNOSIS — E1122 Type 2 diabetes mellitus with diabetic chronic kidney disease: Secondary | ICD-10-CM | POA: Diagnosis not present

## 2023-07-13 DIAGNOSIS — Z1231 Encounter for screening mammogram for malignant neoplasm of breast: Secondary | ICD-10-CM | POA: Diagnosis not present

## 2023-07-13 DIAGNOSIS — I5032 Chronic diastolic (congestive) heart failure: Secondary | ICD-10-CM | POA: Diagnosis not present

## 2023-07-13 DIAGNOSIS — E1140 Type 2 diabetes mellitus with diabetic neuropathy, unspecified: Secondary | ICD-10-CM | POA: Diagnosis not present

## 2023-07-15 DIAGNOSIS — G4733 Obstructive sleep apnea (adult) (pediatric): Secondary | ICD-10-CM | POA: Diagnosis not present

## 2023-07-17 DIAGNOSIS — E1140 Type 2 diabetes mellitus with diabetic neuropathy, unspecified: Secondary | ICD-10-CM | POA: Diagnosis not present

## 2023-07-17 DIAGNOSIS — N1832 Chronic kidney disease, stage 3b: Secondary | ICD-10-CM | POA: Diagnosis not present

## 2023-07-31 DIAGNOSIS — I5030 Unspecified diastolic (congestive) heart failure: Secondary | ICD-10-CM | POA: Diagnosis not present

## 2023-07-31 DIAGNOSIS — I272 Pulmonary hypertension, unspecified: Secondary | ICD-10-CM | POA: Diagnosis not present

## 2023-08-10 DIAGNOSIS — Z1231 Encounter for screening mammogram for malignant neoplasm of breast: Secondary | ICD-10-CM | POA: Diagnosis not present

## 2023-08-10 DIAGNOSIS — E1140 Type 2 diabetes mellitus with diabetic neuropathy, unspecified: Secondary | ICD-10-CM | POA: Diagnosis not present

## 2023-08-10 DIAGNOSIS — Q845 Enlarged and hypertrophic nails: Secondary | ICD-10-CM | POA: Diagnosis not present

## 2023-08-15 DIAGNOSIS — G4733 Obstructive sleep apnea (adult) (pediatric): Secondary | ICD-10-CM | POA: Diagnosis not present

## 2023-08-16 DIAGNOSIS — N1832 Chronic kidney disease, stage 3b: Secondary | ICD-10-CM | POA: Diagnosis not present

## 2023-08-16 DIAGNOSIS — E1140 Type 2 diabetes mellitus with diabetic neuropathy, unspecified: Secondary | ICD-10-CM | POA: Diagnosis not present

## 2023-08-30 DIAGNOSIS — I5030 Unspecified diastolic (congestive) heart failure: Secondary | ICD-10-CM | POA: Diagnosis not present

## 2023-08-30 DIAGNOSIS — I272 Pulmonary hypertension, unspecified: Secondary | ICD-10-CM | POA: Diagnosis not present

## 2023-09-01 DIAGNOSIS — L609 Nail disorder, unspecified: Secondary | ICD-10-CM | POA: Diagnosis not present

## 2023-09-01 DIAGNOSIS — L918 Other hypertrophic disorders of the skin: Secondary | ICD-10-CM | POA: Insufficient documentation

## 2023-09-01 DIAGNOSIS — E1142 Type 2 diabetes mellitus with diabetic polyneuropathy: Secondary | ICD-10-CM | POA: Insufficient documentation

## 2023-09-30 DIAGNOSIS — I5030 Unspecified diastolic (congestive) heart failure: Secondary | ICD-10-CM | POA: Diagnosis not present

## 2023-09-30 DIAGNOSIS — I272 Pulmonary hypertension, unspecified: Secondary | ICD-10-CM | POA: Diagnosis not present

## 2023-10-17 DIAGNOSIS — N1832 Chronic kidney disease, stage 3b: Secondary | ICD-10-CM | POA: Diagnosis not present

## 2023-10-17 DIAGNOSIS — E1140 Type 2 diabetes mellitus with diabetic neuropathy, unspecified: Secondary | ICD-10-CM | POA: Diagnosis not present

## 2023-10-20 DIAGNOSIS — K08109 Complete loss of teeth, unspecified cause, unspecified class: Secondary | ICD-10-CM | POA: Insufficient documentation

## 2023-10-26 DIAGNOSIS — E1159 Type 2 diabetes mellitus with other circulatory complications: Secondary | ICD-10-CM | POA: Diagnosis not present

## 2023-10-26 DIAGNOSIS — E1122 Type 2 diabetes mellitus with diabetic chronic kidney disease: Secondary | ICD-10-CM | POA: Diagnosis not present

## 2023-10-26 DIAGNOSIS — G932 Benign intracranial hypertension: Secondary | ICD-10-CM | POA: Diagnosis not present

## 2023-10-31 DIAGNOSIS — I5030 Unspecified diastolic (congestive) heart failure: Secondary | ICD-10-CM | POA: Diagnosis not present

## 2023-11-02 DIAGNOSIS — E785 Hyperlipidemia, unspecified: Secondary | ICD-10-CM | POA: Diagnosis not present

## 2023-11-02 DIAGNOSIS — E1140 Type 2 diabetes mellitus with diabetic neuropathy, unspecified: Secondary | ICD-10-CM | POA: Diagnosis not present

## 2023-11-02 DIAGNOSIS — N1832 Chronic kidney disease, stage 3b: Secondary | ICD-10-CM | POA: Diagnosis not present

## 2023-11-02 DIAGNOSIS — E1169 Type 2 diabetes mellitus with other specified complication: Secondary | ICD-10-CM | POA: Diagnosis not present

## 2023-11-16 DIAGNOSIS — E1140 Type 2 diabetes mellitus with diabetic neuropathy, unspecified: Secondary | ICD-10-CM | POA: Diagnosis not present

## 2023-11-16 DIAGNOSIS — N1832 Chronic kidney disease, stage 3b: Secondary | ICD-10-CM | POA: Diagnosis not present

## 2023-11-26 DIAGNOSIS — G4733 Obstructive sleep apnea (adult) (pediatric): Secondary | ICD-10-CM | POA: Diagnosis not present

## 2023-11-30 DIAGNOSIS — I272 Pulmonary hypertension, unspecified: Secondary | ICD-10-CM | POA: Diagnosis not present

## 2023-11-30 DIAGNOSIS — I5030 Unspecified diastolic (congestive) heart failure: Secondary | ICD-10-CM | POA: Diagnosis not present

## 2023-12-17 DIAGNOSIS — E1140 Type 2 diabetes mellitus with diabetic neuropathy, unspecified: Secondary | ICD-10-CM | POA: Diagnosis not present

## 2023-12-17 DIAGNOSIS — N1832 Chronic kidney disease, stage 3b: Secondary | ICD-10-CM | POA: Diagnosis not present

## 2024-03-20 ENCOUNTER — Encounter: Payer: Self-pay | Admitting: *Deleted

## 2024-03-22 ENCOUNTER — Encounter: Payer: Self-pay | Admitting: Cardiology

## 2024-03-22 ENCOUNTER — Ambulatory Visit: Admitting: Cardiology

## 2024-03-22 VITALS — BP 120/60 | HR 141 | Ht 69.0 in | Wt 339.0 lb

## 2024-03-22 DIAGNOSIS — E119 Type 2 diabetes mellitus without complications: Secondary | ICD-10-CM

## 2024-03-22 DIAGNOSIS — R0609 Other forms of dyspnea: Secondary | ICD-10-CM

## 2024-03-22 DIAGNOSIS — I5032 Chronic diastolic (congestive) heart failure: Secondary | ICD-10-CM | POA: Diagnosis not present

## 2024-03-22 DIAGNOSIS — I10 Essential (primary) hypertension: Secondary | ICD-10-CM

## 2024-03-22 DIAGNOSIS — I272 Pulmonary hypertension, unspecified: Secondary | ICD-10-CM

## 2024-03-22 NOTE — Progress Notes (Signed)
 " Cardiology Office Note:    Date:  03/22/2024   ID:  Emily Mooney, DOB [birth year], MRN [id number]  PCP:  Charlann Knee, PA  Cardiologist:  Lamar Fitch, MD    Referring MD: Charlann Knee, [place]   Chief Complaint  Patient presents with   Follow-up  Doing fine  History of Present Illness:     Emily Mooney is a 59 y.o. female past medical history significant for diabetes, essential hypertension, obstructive sleep apnea, diastolic dysfunction, morbid obesity, initially referred to us  because of pulmonary hypertension after that she got numerous echocardiogram demonstrating normal pulm artery pressure, no enlargement of the right ventricle no significant tricuspid regurgitation.  Comes today to my office for follow-up.  Overall said she is living very sedentary lifestyle.  She does not exercise on the regular basis.  Past Medical History:  Diagnosis Date   CHF (congestive heart failure) (HCC)    Diabetes mellitus without complication (HCC)    Headache    IIH (idiopathic intracranial hypertension)    Numbness in feet     Past Surgical History:  Procedure Laterality Date   TOOTH EXTRACTION     TUBAL LIGATION      Current Medications: Active Medications[1]   Allergies:   Depo-medrol [methylprednisolone]   Social History   Socioeconomic History   Marital status: Single    Spouse name: Not on file   Number of children: 2   Years of education: Not on file   Highest education level: Not on file  Occupational History   Not on file  Tobacco Use   Smoking status: Former    Current packs/day: 0.00    Average packs/day: 2.0 packs/day    Types: Cigarettes    Quit date: 04/12/2004    Years since quitting: 19.9   Smokeless tobacco: Never  Substance and Sexual Activity   Alcohol  use: Not Currently   Drug use: Never   Sexual activity: Not Currently  Other Topics Concern   Not on file  Social History Narrative   Lives alone   Caffeine- soda a day   Social Drivers of  Health   Tobacco Use: Medium Risk (03/22/2024)   Patient History    Smoking Tobacco Use: Former    Smokeless Tobacco Use: Never    Passive Exposure: Not on [place] Strain: Not on file  Food Insecurity: Not on file  Transportation Needs: Not on file  Physical Activity: Not on file  Stress: Not on file  Social Connections: Not on file  Depression (EYV7-0): Not on file  Alcohol  Screen: Not on file  Housing: Not on file  Utilities: Not on file  Health Literacy: Not on file     Family History: The patient's family history includes Cancer in her father. ROS:   Please see the history of present illness.    All 14 point review of systems negative except as described per history of present illness  EKGs/Labs/Other Studies Reviewed:    EKG Interpretation Date/Time:  Thursday March 22 2024 09:35:54 EST Ventricular Rate:  141 PR Interval:    QRS Duration:  94 QT Interval:  182 QTC Calculation: 278 R Axis:   185  Text Interpretation: NSR Incomplete right bundle branch block Right ventricular hypertrophy with repolarization abnormality Possible Anterolateral infarct , age undetermined No previous ECGs available Confirmed by Fitch Lamar [phone]) on 03/22/2024 9:51:12 AM    Recent Labs: No results found for requested labs within last 365 days.  Recent Lipid Panel  Component Value Date/Time   CHOL 201 (H) 11/05/2021 1006   TRIG 368 (H) 11/05/2021 1006   HDL 43 11/05/2021 1006   CHOLHDL 4.7 (H) 11/05/2021 1006   LDLCALC 97 11/05/2021 1006   LDLDIRECT 79 05/01/2021 1027    Physical Exam:    VS:  BP 120/60 (BP Location: Left Wrist, Patient Position: Sitting)   Pulse (!) 141   Ht 5' 9 (1.753 m)   Wt (!) 339 lb (153.8 kg)   SpO2 97%   BMI 50.06 kg/m     Wt Readings from Last 3 Encounters:  03/22/24 (!) 339 lb (153.8 kg)  10/06/22 (!) 354 lb 3.2 oz (160.7 kg)  05/07/22 (!) 349 lb (158.3 kg)     GEN:  Well nourished, well developed in no acute  distress HEENT: Normal NECK: No JVD; No carotid bruits LYMPHATICS: No lymphadenopathy CARDIAC: RRR, no murmurs, no rubs, no gallops RESPIRATORY:  Clear to auscultation without rales, wheezing or rhonchi  ABDOMEN: Soft, non-tender, non-distended MUSCULOSKELETAL:  No edema; No deformity  SKIN: Warm and dry LOWER EXTREMITIES: no swelling NEUROLOGIC:  Alert and oriented x 3 PSYCHIATRIC:  Normal affect   ASSESSMENT:    1. Primary hypertension   2. Chronic diastolic congestive heart failure (HCC)   3. Pulmonary HTN (HCC)   4. Type 2 diabetes mellitus without complication, without long-term current use of insulin (HCC)   5. Morbid obesity (HCC)    PLAN:    In order of problems listed above:  Essential hypertension, blood pressure well-controlled today continue present management. Diastolic congestive heart failure peers to be compensated on physical exam but assessment is somewhat difficult because of morbid obesity. Questionable pulmonary hypertension will repeat echocardiogram ensure not missing anything. Type 2 diabetes she is on Ozempic and fark [employer] last hemoglobin A1c is 8.1 this is from October 26, 2023 she understands need to do better. Dyslipidemia she is on Crestor  20 which I will continue I did review KPN which show me LDL 59 HDL 45, continue present management. I asked her to start taking 1 baby aspirin every single   Medication Adjustments/Labs and Tests Ordered: Current medicines are reviewed at length with the patient today.  Concerns regarding medicines are outlined above.  Orders Placed This Encounter  Procedures   EKG 12-Lead   Medication changes: No orders of the defined types were placed in this encounter.   Signed, Lamar DOROTHA Fitch, MD, [facility] 03/22/2024 9:58 AM    [facility]    [1]  Current Meds  Medication Sig   carvedilol (COREG) 3.125 MG tablet Take 3.125 mg by mouth 2 (two) times daily.   dapagliflozin propanediol  (FARXIGA) 10 MG TABS tablet Take 10 mg by mouth daily.   fenofibrate micronized (LOFIBRA) 200 MG capsule Take 200 mg by mouth daily.   glipiZIDE (GLUCOTROL) 10 MG tablet Take 10 mg by mouth 2 (two) times daily before a meal.   glucose blood (ACCU-CHEK AVIVA PLUS) test strip 1 each by Other route as needed for other (Glucose).   KERENDIA 10 MG TABS Take 1 tablet by mouth daily.   OXYGEN  Inhale 3 L into the lungs as needed (Active and sleep).   OZEMPIC, 2 MG/DOSE, 8 MG/3ML SOPN Inject 2 mg into the skin once a week.   rosuvastatin  (CRESTOR ) 20 MG tablet Take 1 tablet by mouth once daily   VASCEPA 1 g capsule Take 2 g by mouth 2 (two) times daily.   "

## 2024-03-22 NOTE — Patient Instructions (Signed)
 Medication Instructions:  Your physician recommends that you continue on your current medications as directed. Please refer to the Current Medication list given to you today.  *If you need a refill on your cardiac medications before your next appointment, please call your pharmacy*   Lab Work: None Ordered If you have labs (blood work) drawn today and your tests are completely normal, you will receive your results only by: MyChart Message (if you have MyChart) OR A paper copy in the mail If you have any lab test that is abnormal or we need to change your treatment, we will call you to review the results.   Testing/Procedures: Your physician has requested that you have an echocardiogram. Echocardiography is a painless test that uses sound waves to create images of your heart. It provides your doctor with information about the size and shape of your heart and how well your heart's chambers and valves are working. This procedure takes approximately one hour. There are no restrictions for this procedure. Please do NOT wear cologne, perfume, aftershave, or lotions (deodorant is allowed). Please arrive 15 minutes prior to your appointment time.  Please note: We ask at that you not bring children with you during ultrasound (echo/ vascular) testing. Due to room size and safety concerns, children are not allowed in the ultrasound rooms during exams. Our front office staff cannot provide observation of children in our lobby area while testing is being conducted. An adult accompanying a patient to their appointment will only be allowed in the ultrasound room at the discretion of the ultrasound technician under special circumstances. We apologize for any inconvenience.    Follow-Up: At [facility], you and your health needs are our priority.  As part of our continuing mission to provide you with exceptional heart care, we have created designated Provider Care Teams.  These Care Teams include your  primary Cardiologist (physician) and Advanced Practice Providers (APPs -  Physician Assistants and Nurse Practitioners) who all work together to provide you with the care you need, when you need it.  We recommend signing up for the patient portal called MyChart.  Sign up information is provided on this After Visit Summary.  MyChart is used to connect with patients for Virtual Visits (Telemedicine).  Patients are able to view lab/test results, encounter notes, upcoming appointments, etc.  Non-urgent messages can be sent to your provider as well.   To learn more about what you can do with MyChart, go to [email or web address].    Your next appointment:   12 month(s)  The format for your next appointment:   In Person  Provider:   Lamar Fitch, MD    Other Instructions NA

## 2024-03-22 NOTE — Addendum Note (Signed)
 Addended by: ARLOA MALLORY D on: 03/22/2024 10:04 AM   Modules accepted: Orders

## 2024-04-19 ENCOUNTER — Ambulatory Visit
